# Patient Record
Sex: Male | Born: 1975
Health system: Southern US, Community
[De-identification: ages and names within clinical notes are randomized; demographics above are authoritative.]

## PROBLEM LIST (undated history)

## (undated) DIAGNOSIS — M549 Dorsalgia, unspecified: Secondary | ICD-10-CM

## (undated) DIAGNOSIS — F909 Attention-deficit hyperactivity disorder, unspecified type: Secondary | ICD-10-CM

## (undated) HISTORY — DX: Attention-deficit hyperactivity disorder, unspecified type: F90.9

## (undated) HISTORY — PX: NO PAST SURGERIES: SHX2092

---

## 2001-01-12 ENCOUNTER — Emergency Department (HOSPITAL_COMMUNITY): Admission: EM | Admit: 2001-01-12 | Discharge: 2001-01-12 | Payer: Self-pay | Admitting: Emergency Medicine

## 2001-03-07 ENCOUNTER — Emergency Department (HOSPITAL_COMMUNITY): Admission: EM | Admit: 2001-03-07 | Discharge: 2001-03-07 | Payer: Self-pay | Admitting: Emergency Medicine

## 2004-12-04 ENCOUNTER — Emergency Department (HOSPITAL_COMMUNITY): Admission: EM | Admit: 2004-12-04 | Discharge: 2004-12-04 | Payer: Self-pay | Admitting: *Deleted

## 2005-02-19 ENCOUNTER — Emergency Department (HOSPITAL_COMMUNITY): Admission: EM | Admit: 2005-02-19 | Discharge: 2005-02-19 | Payer: Self-pay | Admitting: Family Medicine

## 2009-06-03 ENCOUNTER — Emergency Department (HOSPITAL_COMMUNITY): Admission: EM | Admit: 2009-06-03 | Discharge: 2009-06-03 | Payer: Self-pay | Admitting: Emergency Medicine

## 2012-02-04 ENCOUNTER — Emergency Department (HOSPITAL_COMMUNITY)
Admission: EM | Admit: 2012-02-04 | Discharge: 2012-02-04 | Disposition: A | Discharge status: Home / Self Care | Payer: Medicaid Other | Attending: Emergency Medicine | Admitting: Emergency Medicine

## 2012-02-04 ENCOUNTER — Emergency Department (HOSPITAL_COMMUNITY): Payer: Medicaid Other

## 2012-02-04 ENCOUNTER — Encounter (HOSPITAL_COMMUNITY): Payer: Self-pay

## 2012-02-04 DIAGNOSIS — IMO0001 Reserved for inherently not codable concepts without codable children: Secondary | ICD-10-CM | POA: Insufficient documentation

## 2012-02-04 DIAGNOSIS — M533 Sacrococcygeal disorders, not elsewhere classified: Secondary | ICD-10-CM | POA: Insufficient documentation

## 2012-02-04 DIAGNOSIS — W1809XA Striking against other object with subsequent fall, initial encounter: Secondary | ICD-10-CM | POA: Insufficient documentation

## 2012-02-04 DIAGNOSIS — M791 Myalgia, unspecified site: Secondary | ICD-10-CM

## 2012-02-04 DIAGNOSIS — R079 Chest pain, unspecified: Secondary | ICD-10-CM | POA: Insufficient documentation

## 2012-02-04 DIAGNOSIS — M5380 Other specified dorsopathies, site unspecified: Secondary | ICD-10-CM | POA: Insufficient documentation

## 2012-02-04 DIAGNOSIS — M549 Dorsalgia, unspecified: Secondary | ICD-10-CM | POA: Insufficient documentation

## 2012-02-04 MED ORDER — NAPROXEN 500 MG PO TABS: 500.0000 mg | ORAL_TABLET | Freq: Two times a day (BID) | ORAL | Status: DC

## 2012-02-04 MED ORDER — METHOCARBAMOL 500 MG PO TABS: 500.0000 mg | ORAL_TABLET | Freq: Two times a day (BID) | ORAL | Status: AC

## 2012-02-04 MED ORDER — DIAZEPAM 5 MG PO TABS
5.0000 mg | ORAL_TABLET | Freq: Once | ORAL | Status: AC
  Administered 2012-02-04: 5 mg via ORAL
  Filled 2012-02-04: qty 1

## 2012-02-04 MED ORDER — DEXAMETHASONE SODIUM PHOSPHATE 10 MG/ML IJ SOLN
10.0000 mg | Freq: Once | INTRAMUSCULAR | Status: AC
  Administered 2012-02-04: 10 mg via INTRAMUSCULAR
  Filled 2012-02-04: qty 1

## 2012-02-04 MED ORDER — KETOROLAC TROMETHAMINE 60 MG/2ML IM SOLN
60.0000 mg | Freq: Once | INTRAMUSCULAR | Status: AC
  Administered 2012-02-04: 60 mg via INTRAMUSCULAR
  Filled 2012-02-04: qty 2

## 2012-02-04 MED ORDER — TRAMADOL HCL 50 MG PO TABS: 50.0000 mg | ORAL_TABLET | Freq: Four times a day (QID) | ORAL | Status: AC | PRN

## 2012-02-04 NOTE — Progress Notes (Signed)
Pt states he has been previously to [facility] But has not been seen lately.  CM noted pt listed as medicaid pt. CM provided pt with [facility] number to obtain a new pcp and have DSS to apply to his card.

## 2012-02-04 NOTE — ED Notes (Signed)
Pt states fell 4' off a machine on Saturday, c/o tailbone, lower back pain.

## 2012-02-04 NOTE — ED Provider Notes (Signed)
Medical screening examination/treatment/procedure(s) were performed by non-physician practitioner and as supervising physician I was immediately available for consultation/collaboration.   Dione Booze, MD 02/04/12 [phone]

## 2012-02-04 NOTE — Discharge Instructions (Signed)
Musculoskeletal Pain Musculoskeletal pain is muscle and boney aches and pains. These pains can occur in any part of the body. Your caregiver may treat you without knowing the cause of the pain. They may treat you if blood or urine tests, X-rays, and other tests were normal.  CAUSES There is often not a definite cause or reason for these pains. These pains may be caused by a type of germ (virus). The discomfort may also come from overuse. Overuse includes working out too hard when your body is not fit. Boney aches also come from weather changes. Bone is sensitive to atmospheric pressure changes. HOME CARE INSTRUCTIONS   Ask when your test results will be ready. Make sure you get your test results.   Only take over-the-counter or prescription medicines for pain, discomfort, or fever as directed by your caregiver. If you were given medications for your condition, do not drive, operate machinery or power tools, or sign legal documents for 24 hours. Do not drink alcohol. Do not take sleeping pills or other medications that may interfere with treatment.   Continue all activities unless the activities cause more pain. When the pain lessens, slowly resume normal activities. Gradually increase the intensity and duration of the activities or exercise.   During periods of severe pain, bed rest may be helpful. Lay or sit in any position that is comfortable.   Putting ice on the injured area.   Put ice in a bag.   Place a towel between your skin and the bag.   Leave the ice on for 15 to 20 minutes, 3 to 4 times a day.   Follow up with your caregiver for continued problems and no reason can be found for the pain. If the pain becomes worse or does not go away, it may be necessary to repeat tests or do additional testing. Your caregiver may need to look further for a possible cause.  SEEK IMMEDIATE MEDICAL CARE IF:  You have pain that is getting worse and is not relieved by medications.   You develop  chest pain that is associated with shortness or breath, sweating, feeling sick to your stomach (nauseous), or throw up (vomit).   Your pain becomes localized to the abdomen.   You develop any new symptoms that seem different or that concern you.  MAKE SURE YOU:   Understand these instructions.   Will watch your condition.   Will get help right away if you are not doing well or get worse.  Document Released: 11/26/2005 Document Revised: 08/08/2011 Document Reviewed: 07/16/2008 Exit[employer] Patient Information 2012 [employer]. 

## 2012-02-04 NOTE — ED Notes (Signed)
Pt is stable at d/c 

## 2012-02-04 NOTE — ED Provider Notes (Signed)
History     CSN: [id number]  Arrival date & time 02/04/12  1113   First MD Initiated Contact with Patient 02/04/12 1140      12:00 PM HPI Patient reports 2 days ago fell backwards off of a machine and onto his buttocks. Reports since then has had significant right buttock pain and right mid back. Denies blood in urine. Denies numbness, tingling, weakness. Denies radiation of pain. Denies saddle anesthesias, perineal numbness, incontinence, abdominal pain, hematuria. Patient is a 36 y.o. male presenting with fall. The history is provided by the patient.  Fall The accident occurred 2 days ago. He fell from a height of 3 to 5 ft. He landed on a hard floor. There was no blood loss. Point of impact: Right buttock and back. Pain location: Right buttock and back. The pain is severe. He was ambulatory at the scene. There was no entrapment after the fall. There was no drug use involved in the accident. There was no alcohol use involved in the accident. Pertinent negatives include no fever, no numbness, no abdominal pain, no bowel incontinence, no nausea, no vomiting, no hematuria, no headaches, no loss of consciousness and no tingling. The symptoms are aggravated by activity, standing, sitting and ambulation. He has tried NSAIDs for the symptoms. The treatment provided no relief.    History reviewed. No pertinent past medical history.  History reviewed. No pertinent past surgical history.  No family history on file.  History  Substance Use Topics  . Smoking status: Current Everyday Smoker  . Smokeless tobacco: Not on file  . Alcohol Use: Yes      Review of Systems  Constitutional: Negative for fever and chills.  HENT: Negative for neck pain.   Respiratory: Negative for cough and shortness of breath.   Cardiovascular: Negative for chest pain and palpitations.  Gastrointestinal: Negative for nausea, vomiting, abdominal pain and bowel incontinence.  Genitourinary: Negative for dysuria,  hematuria and flank pain.  Musculoskeletal: Positive for back pain. Negative for myalgias and gait problem.       Denies saddle anesthesias, perineal numbness, bowel incontinence, urinary incontinence  Neurological: Negative for dizziness, tingling, loss of consciousness, weakness, numbness and headaches.  All other systems reviewed and are negative.    Allergies  Review of patient's allergies indicates no known allergies.  Home Medications   Current Outpatient Rx  Name Route Sig Dispense Refill  . IBUPROFEN 200 MG PO TABS Oral Take 200 mg by mouth every 6 (six) hours as needed. pain    . LISDEXAMFETAMINE DIMESYLATE 50 MG PO CAPS Oral Take 50 mg by mouth every morning.    Marland Kitchen TRAMADOL HCL 50 MG PO TABS Oral Take 50-100 mg by mouth every 6 (six) hours as needed. pain      BP 136/79  Pulse 100  Temp(Src) 97.8 F (36.6 C) (Oral)  Resp 24  SpO2 100%  Physical Exam  Constitutional: He is oriented to person, place, and time. He appears well-developed and well-nourished.  HENT:  Head: Normocephalic and atraumatic.  Eyes: Conjunctivae are normal. Pupils are equal, round, and reactive to light.  Neck: Normal range of motion. Neck supple.  Cardiovascular: Normal rate, regular rhythm and normal heart sounds.   Pulmonary/Chest: Effort normal and breath sounds normal.  Abdominal: Soft. Bowel sounds are normal.  Musculoskeletal:       Right hip: He exhibits decreased range of motion and tenderness. He exhibits normal strength, no bony tenderness, no swelling and no laceration.  Thoracic back: He exhibits pain and spasm. He exhibits normal range of motion, no bony tenderness, no swelling, no laceration and normal pulse.       Lumbar back: He exhibits tenderness, pain and spasm. He exhibits normal range of motion, no swelling, no edema and normal pulse.       Back:       Legs: Neurological: He is alert and oriented to person, place, and time.  Skin: Skin is warm and dry. No rash noted.  No erythema. No pallor.  Psychiatric: He has a normal mood and affect. His behavior is normal.    ED Course  Procedures  No results found for this or any previous visit. Dg Ribs Unilateral W/chest Right  02/04/2012  *RADIOLOGY REPORT*  Clinical Data: Fall.  Right-sided rib pain.  RIGHT RIBS AND CHEST - 3+ VIEW  Comparison: None.  Findings: Heart size is normal.  Mediastinal shadows are normal. Lungs are clear.  No pneumothorax or hemothorax.  Rib details do not show any discernible fractures.  IMPRESSION: Negative radiographs  Original Report Authenticated By: Thomasenia Sales, M.D.   Dg Lumbar Spine Complete  02/04/2012  *RADIOLOGY REPORT*  Clinical Data: Larey Seat.  Back pain.  LUMBAR SPINE - COMPLETE 4+ VIEW  Comparison: None  Findings: The lateral film demonstrates normal alignment. Vertebral bodies and disc spaces are maintained.  No acute bony findings.  Normal alignment of the facet joints and no pars defects.  The visualized bony pelvis in intact.  IMPRESSION: Normal alignment and no acute bony findings.  Original Report Authenticated By: P. Loralie Champagne, M.D.   Dg Sacrum/coccyx  02/04/2012  *RADIOLOGY REPORT*  Clinical Data: Larey Seat.  Sacral pain.  SACRUM AND COCCYX - 2+ VIEW  Comparison: None  Findings: The pubic symphysis and SI joints are intact.  No definite sacral or coccyx fractures.  Degenerative disc disease noted at L5-S1.  IMPRESSION: No acute bony findings.  Original Report Authenticated By: P. Loralie Champagne, M.D.     MDM   Patient had significant pain and spasm of right buttock and right mid back. Has absolutely no bony tenderness. Likely has contusion versus muscle spasms. Will prescribe muscle relaxants and pain medication. Advised gentle range of motion exercises massage and warm compresses. Patient agrees to plan and is ready for discharge.      Thomasene Lot, PA-C 02/04/12 1253

## 2012-05-17 ENCOUNTER — Encounter (HOSPITAL_COMMUNITY): Payer: Self-pay

## 2012-05-17 ENCOUNTER — Emergency Department (HOSPITAL_COMMUNITY): Payer: Medicaid Other

## 2012-05-17 ENCOUNTER — Emergency Department (HOSPITAL_COMMUNITY)
Admission: EM | Admit: 2012-05-17 | Discharge: 2012-05-17 | Disposition: A | Discharge status: Home / Self Care | Payer: Medicaid Other | Attending: Emergency Medicine | Admitting: Emergency Medicine

## 2012-05-17 DIAGNOSIS — M545 Low back pain, unspecified: Secondary | ICD-10-CM | POA: Insufficient documentation

## 2012-05-17 DIAGNOSIS — S20229A Contusion of unspecified back wall of thorax, initial encounter: Secondary | ICD-10-CM

## 2012-05-17 DIAGNOSIS — F17200 Nicotine dependence, unspecified, uncomplicated: Secondary | ICD-10-CM | POA: Insufficient documentation

## 2012-05-17 MED ORDER — DIAZEPAM 5 MG PO TABS
5.0000 mg | ORAL_TABLET | Freq: Once | ORAL | Status: AC
  Administered 2012-05-17: 5 mg via ORAL
  Filled 2012-05-17: qty 1

## 2012-05-17 MED ORDER — METHOCARBAMOL 500 MG PO TABS: ORAL_TABLET | ORAL | Status: DC

## 2012-05-17 MED ORDER — TRAMADOL HCL 50 MG PO TABS: 50.0000 mg | ORAL_TABLET | Freq: Four times a day (QID) | ORAL | Status: AC | PRN

## 2012-05-17 MED ORDER — DEXAMETHASONE SODIUM PHOSPHATE 4 MG/ML IJ SOLN
10.0000 mg | Freq: Once | INTRAMUSCULAR | Status: AC
  Administered 2012-05-17: 10 mg via INTRAMUSCULAR
  Filled 2012-05-17: qty 3

## 2012-05-17 NOTE — ED Provider Notes (Signed)
History     CSN: [id number]  Arrival date & time 05/17/12  1525   First MD Initiated Contact with Patient 05/17/12 1548      Chief Complaint  Patient presents with  . Tailbone Pain    (Consider location/radiation/quality/duration/timing/severity/associated sxs/prior treatment) HPI Comments: Patient c/o lower back pain and pain to his tailbone after he fell on some steps two days prior to arrival.  He denies other injuries, incontinence, urinary sx's or weakness to his extrmeities  Patient is a 36 y.o. male presenting with back pain. The history is provided by the patient.  Back Pain  This is a new problem. The current episode started 2 days ago. The problem occurs constantly. The problem has not changed since onset.The pain is associated with falling. The pain is present in the lumbar spine (coccyx). The quality of the pain is described as aching. The pain does not radiate. The pain is moderate. The symptoms are aggravated by bending, certain positions and twisting. The pain is the same all the time. Pertinent negatives include no chest pain, no fever, no numbness, no abdominal pain, no abdominal swelling, no bowel incontinence, no perianal numbness, no bladder incontinence, no dysuria, no pelvic pain, no leg pain, no paresthesias, no paresis, no tingling and no weakness. He has tried NSAIDs for the symptoms. The treatment provided no relief.    History reviewed. No pertinent past medical history.  History reviewed. No pertinent past surgical history.  No family history on file.  History  Substance Use Topics  . Smoking status: Current Everyday Smoker  . Smokeless tobacco: Not on file  . Alcohol Use: Yes      Review of Systems  Constitutional: Negative for fever.  Respiratory: Negative for shortness of breath.   Cardiovascular: Negative for chest pain.  Gastrointestinal: Negative for vomiting, abdominal pain, constipation and bowel incontinence.  Genitourinary: Negative for  bladder incontinence, dysuria, frequency, hematuria, flank pain, decreased urine volume, difficulty urinating, testicular pain and pelvic pain.       Perineal numbness or incontinence of urine or feces  Musculoskeletal: Positive for back pain. Negative for joint swelling.  Skin: Negative for rash.  Neurological: Negative for tingling, weakness, numbness and paresthesias.  All other systems reviewed and are negative.    Allergies  Review of patient's allergies indicates no known allergies.  Home Medications   Current Outpatient Rx  Name Route Sig Dispense Refill  . IBUPROFEN 200 MG PO TABS Oral Take 200 mg by mouth every 6 (six) hours as needed. pain    . LISDEXAMFETAMINE DIMESYLATE 50 MG PO CAPS Oral Take 50 mg by mouth every morning.    Marland Kitchen NAPROXEN 500 MG PO TABS Oral Take 1 tablet (500 mg total) by mouth 2 (two) times daily. 30 tablet 0  . TRAMADOL HCL 50 MG PO TABS Oral Take 50-100 mg by mouth every 6 (six) hours as needed. pain      BP 120/78  Pulse 110  Temp(Src) 97.7 F (36.5 C) (Oral)  Resp 20  Ht 6\' 1"  (1.854 m)  Wt 210 lb (95.255 kg)  BMI 27.71 kg/m2  SpO2 97%  Physical Exam  Nursing note and vitals reviewed. Constitutional: He is oriented to person, place, and time. He appears well-developed and well-nourished. No distress.  HENT:  Head: Normocephalic and atraumatic.  Neck: Normal range of motion. Neck supple.  Cardiovascular: Normal rate, regular rhythm and intact distal pulses.   No murmur heard. Pulmonary/Chest: Effort normal and breath sounds normal.  Musculoskeletal: He exhibits tenderness. He exhibits no edema.       Lumbar back: He exhibits tenderness and pain. He exhibits normal range of motion, no swelling, no edema, no deformity, no laceration and normal pulse.       Back:       ttp of the lower lumbar spine,  No bruising or deformity on exam.  Neurological: He is alert and oriented to person, place, and time. No cranial nerve deficit or sensory  deficit. He exhibits normal muscle tone. Coordination and gait normal.  Reflex Scores:      Patellar reflexes are 2+ on the right side and 2+ on the left side.      Achilles reflexes are 2+ on the right side and 2+ on the left side. Skin: Skin is warm and dry.    ED Course  Procedures (including critical care time)  Labs Reviewed - No data to display Dg Lumbar Spine Complete  05/17/2012  *RADIOLOGY REPORT*  Clinical Data: Tail bone pain  LUMBAR SPINE - COMPLETE 4+ VIEW  Comparison: 02/04/2012  Findings: Five views of the lumbar spine submitted.  No acute fracture or subluxation.  Mild disc space flattening at L5 S1 level.  Alignment and vertebral height are preserved.  IMPRESSION: No acute fracture or subluxation.  Mild disc space flattening at L5 S1 level.  Original Report Authenticated By: Natasha Mead, M.D.       MDM    Previous medical charts, nursing notes and vitals signs from this visit were reviewed by me   All laboratory results and/or imaging results performed on this visit, if applicable, were reviewed by me and discussed with the patient and/or parent as well as recommendation for follow-up    MEDICATIONS GIVEN IN ED: decadron IM, valium po  Patient has ttp of the lumbar paraspinal muscles mainly to the left.  No focal neuro deficits on exam.  Ambulates with a steady gait.       PRESCRIPTIONS GIVEN AT DISCHARGE: tramadol, flexeril    Pt stable in ED with no significant deterioration in condition. Pt feels improved after observation and/or treatment in ED. Patient / Family / Caregiver understand and agree with initial ED impression and plan with expectations set for ED visit.  Patient agrees to return to ED for any worsening symptoms          Keyanah Kozicki L. [place], [place] 05/21/12 1641

## 2012-05-17 NOTE — Discharge Instructions (Signed)
Contusion  A contusion is a deep bruise. Contusions happen when an injury causes bleeding under the skin. Signs of bruising include pain, puffiness (swelling), and discolored skin. The contusion may turn blue, purple, or yellow.  HOME CARE    Put ice on the injured area.   Put ice in a plastic bag.   Place a towel between your skin and the bag.   Leave the ice on for 15 to 20 minutes, 3 to 4 times a day.   Only take medicine as told by your doctor.   Rest the injured area.   If possible, raise (elevate) the injured area to lessen puffiness.  GET HELP RIGHT AWAY IF:    You have more bruising or puffiness.   You have pain that is getting worse.   Your puffiness or pain is not helped by medicine.  MAKE SURE YOU:    Understand these instructions.   Will watch your condition.   Will get help right away if you are not doing well or get worse.  Document Released: 05/14/2008 Document Revised: 11/15/2011 Document Reviewed: 10/01/2011  [employer] Patient Information 2012 [employer].

## 2012-05-17 NOTE — ED Notes (Signed)
Complain of low back pain. States he fell on steps two days ago and now tailbone hurts

## 2012-05-21 NOTE — ED Provider Notes (Signed)
Medical screening examination/treatment/procedure(s) were performed by non-physician practitioner and as supervising physician I was immediately available for consultation/collaboration.  Ramello Cordial, MD 05/21/12 2207 

## 2012-07-12 ENCOUNTER — Emergency Department (HOSPITAL_COMMUNITY)
Admission: EM | Admit: 2012-07-12 | Discharge: 2012-07-12 | Disposition: A | Discharge status: Home / Self Care | Payer: Self-pay | Attending: Emergency Medicine | Admitting: Emergency Medicine

## 2012-07-12 ENCOUNTER — Encounter (HOSPITAL_COMMUNITY): Payer: Self-pay

## 2012-07-12 DIAGNOSIS — M5440 Lumbago with sciatica, unspecified side: Secondary | ICD-10-CM

## 2012-07-12 DIAGNOSIS — M5430 Sciatica, unspecified side: Secondary | ICD-10-CM | POA: Insufficient documentation

## 2012-07-12 DIAGNOSIS — F17200 Nicotine dependence, unspecified, uncomplicated: Secondary | ICD-10-CM | POA: Insufficient documentation

## 2012-07-12 HISTORY — DX: Dorsalgia, unspecified: M54.9

## 2012-07-12 MED ORDER — PENICILLIN V POTASSIUM 500 MG PO TABS: 500.0000 mg | ORAL_TABLET | Freq: Once | ORAL | Status: DC

## 2012-07-12 MED ORDER — TRAMADOL HCL 50 MG PO TABS: 50.0000 mg | ORAL_TABLET | Freq: Four times a day (QID) | ORAL | Status: AC | PRN

## 2012-07-12 MED ORDER — TRIAMCINOLONE ACETONIDE 40 MG/ML IJ SUSP
40.0000 mg | Freq: Once | INTRAMUSCULAR | Status: AC
  Administered 2012-07-12: 40 mg via INTRAMUSCULAR
  Filled 2012-07-12: qty 1
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
## 2012-07-12 NOTE — ED Notes (Addendum)
Pt presents with no acute distress- Pt seen at [facility] in 05/17/2012 for presenting complaint.  Pt here for pain medication and refill of other medication.  No new symptoms c/o of back pain radiates to rt knee-

## 2012-07-13 NOTE — ED Provider Notes (Signed)
Medical screening examination/treatment/procedure(s) were performed by non-physician practitioner and as supervising physician I was immediately available for consultation/collaboration.   Lyanne Co, MD 07/13/12 0010

## 2012-08-15 ENCOUNTER — Encounter (HOSPITAL_COMMUNITY): Payer: Self-pay | Admitting: *Deleted

## 2012-08-15 ENCOUNTER — Emergency Department (HOSPITAL_COMMUNITY)
Admission: EM | Admit: 2012-08-15 | Discharge: 2012-08-15 | Disposition: A | Discharge status: Home / Self Care | Payer: Self-pay | Attending: Emergency Medicine | Admitting: Emergency Medicine

## 2012-08-15 DIAGNOSIS — F17200 Nicotine dependence, unspecified, uncomplicated: Secondary | ICD-10-CM | POA: Insufficient documentation

## 2012-08-15 DIAGNOSIS — M549 Dorsalgia, unspecified: Secondary | ICD-10-CM | POA: Insufficient documentation

## 2012-08-15 MED ORDER — IBUPROFEN 800 MG PO TABS
800.0000 mg | ORAL_TABLET | Freq: Once | ORAL | Status: AC
  Administered 2012-08-15: 800 mg via ORAL
  Filled 2012-08-15: qty 1

## 2012-08-15 MED ORDER — IBUPROFEN 600 MG PO TABS: 600.0000 mg | ORAL_TABLET | Freq: Four times a day (QID) | ORAL | Status: AC | PRN

## 2012-08-15 MED ORDER — OXYCODONE-ACETAMINOPHEN 5-325 MG PO TABS
1.0000 | ORAL_TABLET | Freq: Once | ORAL | Status: AC
  Administered 2012-08-15: 1 via ORAL
  Filled 2012-08-15: qty 1
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
## 2012-08-15 NOTE — ED Notes (Signed)
Pt upset about the medication he received. Says he wants to see another doctor.  Want rx for tramadol.  Dr Bebe Shaggy aware. Told pt he would have to be d/c and check back in to see another doctor.

## 2012-08-15 NOTE — ED Notes (Signed)
C/o lower back pain with pain radiating to right buttock and right leg

## 2012-08-15 NOTE — ED Notes (Signed)
Back pain with radiation down rt leg, alert, talking

## 2012-11-01 ENCOUNTER — Encounter (HOSPITAL_COMMUNITY): Payer: Self-pay | Admitting: Emergency Medicine

## 2012-11-01 ENCOUNTER — Emergency Department (HOSPITAL_COMMUNITY)
Admission: EM | Admit: 2012-11-01 | Discharge: 2012-11-01 | Disposition: A | Discharge status: Home / Self Care | Payer: Self-pay | Attending: Emergency Medicine | Admitting: Emergency Medicine

## 2012-11-01 ENCOUNTER — Emergency Department (HOSPITAL_COMMUNITY): Payer: Self-pay

## 2012-11-01 DIAGNOSIS — IMO0002 Reserved for concepts with insufficient information to code with codable children: Secondary | ICD-10-CM | POA: Insufficient documentation

## 2012-11-01 DIAGNOSIS — F17200 Nicotine dependence, unspecified, uncomplicated: Secondary | ICD-10-CM | POA: Insufficient documentation

## 2012-11-01 DIAGNOSIS — S0990XA Unspecified injury of head, initial encounter: Secondary | ICD-10-CM | POA: Insufficient documentation

## 2012-11-01 DIAGNOSIS — H1130 Conjunctival hemorrhage, unspecified eye: Secondary | ICD-10-CM

## 2012-11-01 DIAGNOSIS — S20219A Contusion of unspecified front wall of thorax, initial encounter: Secondary | ICD-10-CM

## 2012-11-01 MED ORDER — PREDNISONE 50 MG PO TABS: 50.0000 mg | ORAL_TABLET | Freq: Every day | ORAL | Status: DC

## 2012-11-01 MED ORDER — DEXAMETHASONE SODIUM PHOSPHATE 10 MG/ML IJ SOLN
10.0000 mg | Freq: Once | INTRAMUSCULAR | Status: AC
  Administered 2012-11-01: 10 mg via INTRAMUSCULAR
  Filled 2012-11-01: qty 1

## 2012-11-01 MED ORDER — OXYCODONE-ACETAMINOPHEN 5-325 MG PO TABS
1.0000 | ORAL_TABLET | Freq: Once | ORAL | Status: AC
  Administered 2012-11-01: 1 via ORAL
  Filled 2012-11-01: qty 1

## 2012-11-01 MED ORDER — CYCLOBENZAPRINE HCL 10 MG PO TABS: 10.0000 mg | ORAL_TABLET | Freq: Three times a day (TID) | ORAL | Status: DC | PRN

## 2012-11-01 MED ORDER — HYDROCODONE-ACETAMINOPHEN 5-325 MG PO TABS: 1.0000 | ORAL_TABLET | Freq: Four times a day (QID) | ORAL | Status: DC | PRN

## 2012-11-01 MED ORDER — IBUPROFEN 800 MG PO TABS
800.0000 mg | ORAL_TABLET | Freq: Once | ORAL | Status: AC
  Administered 2012-11-01: 800 mg via ORAL
  Filled 2012-11-01: qty 1
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
## 2012-11-01 NOTE — ED Notes (Signed)
Patient not in room at this time.

## 2012-11-01 NOTE — ED Notes (Signed)
Patient given discharge instructions, information, prescriptions, and diet order. Patient states that they adequately understand discharge information given and to return to ED if symptoms return or worsen.     
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
## 2012-11-04 NOTE — ED Provider Notes (Signed)
Medical screening examination/treatment/procedure(s) were conducted as a shared visit with non-physician practitioner(s) and myself.  I personally evaluated the patient during the encounter.  No neuro deficits. CT head and CT maxillofacial negative for fracture  Donnetta Hutching, MD 11/04/12 [phone]

## 2014-02-16 ENCOUNTER — Emergency Department (HOSPITAL_COMMUNITY)
Admission: EM | Admit: 2014-02-16 | Discharge: 2014-02-16 | Discharge status: Against Medical Advice | Payer: Medicaid Other

## 2018-09-10 ENCOUNTER — Encounter (HOSPITAL_COMMUNITY): Payer: Self-pay

## 2018-09-10 ENCOUNTER — Ambulatory Visit (HOSPITAL_COMMUNITY)
Admission: EM | Admit: 2018-09-10 | Discharge: 2018-09-10 | Disposition: A | Discharge status: Home / Self Care | Payer: Self-pay | Attending: Family Medicine | Admitting: Family Medicine

## 2018-09-10 DIAGNOSIS — H6993 Unspecified Eustachian tube disorder, bilateral: Secondary | ICD-10-CM

## 2018-09-10 DIAGNOSIS — T7840XA Allergy, unspecified, initial encounter: Secondary | ICD-10-CM

## 2018-09-10 DIAGNOSIS — H6983 Other specified disorders of Eustachian tube, bilateral: Secondary | ICD-10-CM

## 2018-09-10 MED ORDER — CETIRIZINE HCL 10 MG PO TABS: 10.0000 mg | ORAL_TABLET | Freq: Every day | ORAL | 0 refills | Status: DC

## 2018-09-10 MED ORDER — HYDROXYZINE HCL 25 MG PO TABS: 25.0000 mg | ORAL_TABLET | ORAL | 0 refills | Status: DC | PRN

## 2018-09-10 MED ORDER — KETOTIFEN FUMARATE 0.025 % OP SOLN: 1.0000 [drp] | Freq: Two times a day (BID) | OPHTHALMIC | 0 refills | Status: DC

## 2018-09-10 MED ORDER — FLUTICASONE PROPIONATE 50 MCG/ACT NA SUSP: 1.0000 | Freq: Every day | NASAL | 2 refills | Status: DC

## 2018-09-10 NOTE — Discharge Instructions (Signed)
It was nice meeting you!! We will give you Zyrtec, Senatore and Flonase for your symptoms Make sure you are staying hydrated with plenty of water Symptoms could also be related to anxiety I will give you a few atarax to take as needed for panic attack.  Follow up as needed for continued or worsening symptoms

## 2018-09-10 NOTE — ED Triage Notes (Signed)
Pt presents with dizziness, shortness of breath, headache, ringing in ears, pale, clammy, and nausea

## 2018-09-10 NOTE — ED Provider Notes (Signed)
[facility]    CSN: [id number] Arrival date & time: 09/10/18  1249     History   Chief Complaint Chief Complaint  Patient presents with  . Dizziness  . Sweating  . Headache  . Ringing in Ears    HPI ZYERE JIMINEZ is a 42 y.o. male.   Pt is a 42 year old male that presents with intermittent dizziness, headache, sinus congestion and fullness in the ears. This has been coming and going for a while. What concerned him is that this am he had an episode at work today where he felt dizzy, lightheaded and had some chest tightness and diaphoresis. He was working outside in the heat. Reports he has been staying hydrated. He worked out this am and had a big breakfast. His coworkers were concerned for his BP. His BP is normal here. He does suffer from allergies and has been having a lot of fullness in the ears, facial pressure and burning and watering of the eyes. Sometimes he has ringing in the ears. He also reports a lot of stress recently. He has had anxiety attacks in the past. Denies anything being super stressed lately. He denies any current chest pain, SOB, dizziness, vision changes. He previously used allergy eye drops in the past.   ROS per HPI      Past Medical History:  Diagnosis Date  . Back pain     There are no active problems to display for this patient.   History reviewed. No pertinent surgical history.     Home Medications    Prior to Admission medications   Medication Sig Start Date End Date Taking? Authorizing Provider  cetirizine (ZYRTEC) 10 MG tablet Take 1 tablet (10 mg total) by mouth daily. 09/10/18   Dahlia Byes A, NP  cyclobenzaprine (FLEXERIL) 10 MG tablet Take 1 tablet (10 mg total) by mouth 3 (three) times daily as needed for muscle spasms. 11/01/12   Lawyer, Cristal Deer, PA-C  fluticasone (FLONASE) 50 MCG/ACT nasal spray Place 1 spray into both nostrils daily. 09/10/18   Dahlia Byes A, NP  HYDROcodone-acetaminophen (NORCO/VICODIN) 5-325  MG per tablet Take 1 tablet by mouth every 6 (six) hours as needed for pain. 11/01/12   Lawyer, Cristal Deer, PA-C  hydrOXYzine (ATARAX/VISTARIL) 25 MG tablet Take 1 tablet (25 mg total) by mouth as needed (for anxiety attacks.). 09/10/18   Dahlia Byes A, NP  ibuprofen (ADVIL,MOTRIN) 200 MG tablet Take 200 mg by mouth every 6 (six) hours as needed. pain    [provider]  ketotifen (ZADITOR) 0.025 % ophthalmic solution Place 1 drop into both eyes 2 (two) times daily. 09/10/18   Dahlia Byes A, NP  predniSONE (DELTASONE) 50 MG tablet Take 1 tablet (50 mg total) by mouth daily. 11/01/12   Charlestine Night, PA-C    Family History History reviewed. No pertinent family history.  Social History Social History   Tobacco Use  . Smoking status: Current Every Day Smoker  Substance Use Topics  . Alcohol use: Yes  . Drug use: Not on file     Allergies   Patient has no known allergies.   Review of Systems Review of Systems   Physical Exam Triage Vital Signs ED Triage Vitals [09/10/18 1305]  Enc Vitals Group     BP 127/61     Pulse Rate 84     Resp 20     Temp 98.1 F (36.7 C)     Temp src  SpO2 99 %     Weight      Height      Head Circumference      Peak Flow      Pain Score      Pain Loc      Pain Edu?      Excl. in GC?    No data found.  Updated Vital Signs BP 127/61 (BP Location: Left Arm)   Pulse 84   Temp 98.1 F (36.7 C)   Resp 20   SpO2 99%   Visual Acuity Right Eye Distance:   Left Eye Distance:   Bilateral Distance:    Right Eye Near:   Left Eye Near:    Bilateral Near:     Physical Exam  Constitutional: He is oriented to person, place, and time. He appears well-developed and well-nourished.  Non-toxic appearance. He does not appear ill.  Very pleasant. Non toxic or ill appearing.     HENT:  Head: Normocephalic and atraumatic.  Bilateral TMs normal.  External ears normal.  Without posterior oropharyngeal erythema, tonsillar  swelling or exudates. No lesions.  No lymphadenopathy.     Eyes: Pupils are equal, round, and reactive to light. EOM are normal.  No nystagmus  Neck: Normal range of motion.  Cardiovascular: Normal rate, regular rhythm and normal heart sounds.  Pulmonary/Chest: Effort normal and breath sounds normal.  Lungs clear in all fields. No dyspnea or distress. No retractions or nasal flaring.     Abdominal: Soft. Bowel sounds are normal.  Musculoskeletal: Normal range of motion.  Lymphadenopathy:    He has no cervical adenopathy.  Neurological: He is alert and oriented to person, place, and time. He has normal strength. He is not disoriented. No cranial nerve deficit or sensory deficit.  No focal neuro deficits Cranial nerves intact Reflexes intact Gait normal Negative romberg  Skin: Skin is warm and dry.  Psychiatric: His mood appears anxious.  Nursing note and vitals reviewed.    UC Treatments / Results  Labs (all labs ordered are listed, but only abnormal results are displayed) Labs Reviewed - No data to display  EKG None  Radiology No results found.  Procedures Procedures (including critical care time)  Medications Ordered in UC Medications - No data to display  Initial Impression / Assessment and Plan / UC Course  I have reviewed the triage vital signs and the nursing notes.  Pertinent labs & imaging results that were available during my care of the patient were reviewed by me and considered in my medical decision making (see chart for details).     No focal neuro deficits on exam Vital signs stable, patient nontoxic or ill-appearing EKG showed normal sinus rhythm with normal rate Most likely eustachian tube dysfunction versus sinusitis. Will treat with Flonase, Zyrtec and Zaditor eyedrops Prescribed Atarax as needed for anxiety attacks Follow up as needed for continued or worsening symptoms    Final Clinical Impressions(s) / UC Diagnoses   Final diagnoses:   Dysfunction of both eustachian tubes  Allergic state, initial encounter     Discharge Instructions     It was nice meeting you!! We will give you Zyrtec, Senatore and Flonase for your symptoms Make sure you are staying hydrated with plenty of water Symptoms could also be related to anxiety I will give you a few atarax to take as needed for panic attack.  Follow up as needed for continued or worsening symptoms      ED Prescriptions  Medication Sig Dispense Auth. Provider   fluticasone (FLONASE) 50 MCG/ACT nasal spray Place 1 spray into both nostrils daily. 16 g Stewart Sasaki A, NP   ketotifen (ZADITOR) 0.025 % ophthalmic solution Place 1 drop into both eyes 2 (two) times daily. 5 mL Lafawn Lenoir A, NP   cetirizine (ZYRTEC) 10 MG tablet Take 1 tablet (10 mg total) by mouth daily. 30 tablet Orva Riles A, NP   hydrOXYzine (ATARAX/VISTARIL) 25 MG tablet Take 1 tablet (25 mg total) by mouth as needed (for anxiety attacks.). 6 tablet Dahlia Byes A, NP     Controlled Substance Prescriptions [place] Controlled Substance Registry consulted? Not Applicable   Janace Aris, NP 09/10/18 2142

## 2019-01-22 ENCOUNTER — Encounter: Payer: Self-pay | Admitting: Family Medicine

## 2019-01-22 ENCOUNTER — Ambulatory Visit (INDEPENDENT_AMBULATORY_CARE_PROVIDER_SITE_OTHER): Payer: Self-pay | Admitting: Family Medicine

## 2019-01-22 VITALS — BP 114/77 | HR 73 | Resp 17 | Ht 72.0 in | Wt 223.4 lb

## 2019-01-22 DIAGNOSIS — H5789 Other specified disorders of eye and adnexa: Secondary | ICD-10-CM

## 2019-01-22 DIAGNOSIS — R0981 Nasal congestion: Secondary | ICD-10-CM

## 2019-01-22 DIAGNOSIS — R05 Cough: Secondary | ICD-10-CM

## 2019-01-22 DIAGNOSIS — Z9109 Other allergy status, other than to drugs and biological substances: Secondary | ICD-10-CM

## 2019-01-22 DIAGNOSIS — F17210 Nicotine dependence, cigarettes, uncomplicated: Secondary | ICD-10-CM

## 2019-01-22 DIAGNOSIS — R6889 Other general symptoms and signs: Secondary | ICD-10-CM

## 2019-01-22 DIAGNOSIS — Z7689 Persons encountering health services in other specified circumstances: Secondary | ICD-10-CM

## 2019-01-22 MED ORDER — FLUTICASONE PROPIONATE 50 MCG/ACT NA SUSP: 2.0000 | Freq: Every day | NASAL | 12 refills | Status: DC

## 2019-01-22 MED ORDER — MONTELUKAST SODIUM 10 MG PO TABS: 10.0000 mg | ORAL_TABLET | Freq: Every day | ORAL | 3 refills | Status: DC

## 2019-01-22 MED ORDER — CETIRIZINE HCL 10 MG PO TABS: 10.0000 mg | ORAL_TABLET | Freq: Every day | ORAL | 11 refills | Status: DC

## 2019-01-22 NOTE — Patient Instructions (Addendum)
Thank you for choosing Primary Care at [facility] to be your medical home!    Matthew Gibson was seen by Joaquin Courts, FNP today.   Patrick Jupiter Fedewa's primary care provider is Bing Neighbors, FNP.   For the best care possible, you should try to see Joaquin Courts, FNP-C whenever you come to the clinic.   We look forward to seeing you again soon!  If you have any questions about your visit today, please call us at [phone] or feel free to reach your primary care provider via MyChart.      Allergies, Adult An allergy means that your body reacts to something that bothers it (allergen). It is not a normal reaction. This can happen from something that you:  Eat.  Breathe in.  Touch. You can have an allergy (be allergic) to:  Outdoor things, like: ? Pollen. ? Grass. ? Weeds.  Indoor things, like: ? Dust. ? Smoke. ? Pet dander.  Foods.  Medicines.  Things that bother your skin, like: ? Detergents. ? Chemicals. ? Latex.  Perfume.  Bugs. An allergy cannot spread from person to person (is not contagious). Follow these instructions at home:         Stay away from things that you know you are allergic to.  If you have allergies to things in the air, wash out your nose each day. Do it with one of these: ? A salt-water (saline) spray. ? A container (neti pot).  Take over-the-counter and prescription medicines only as told by your doctor.  Keep all follow-up visits as told by your doctor. This is important.  If you are at risk for a very bad allergy reaction (anaphylaxis), keep an auto-injector with you all the time. This is called an epinephrine injection. ? This is pre-measured medicine with a needle. You can put it into your skin by yourself. ? Right after you have a very bad allergy reaction, you or a person with you must give the medicine in less than a few minutes. This is an emergency.  If you have ever had a very bad allergy reaction, wear a  medical alert bracelet or necklace. Your very bad allergy should be written on it. Contact a health care provider if:  Your symptoms do not get better with treatment. Get help right away if:  You have symptoms of a very bad allergy reaction. These include: ? A swollen mouth, tongue, or throat. ? Pain or tightness in your chest. ? Trouble breathing. ? Being short of breath. ? Dizziness. ? Fainting. ? Very bad pain in your belly (abdomen). ? Throwing up (vomiting). ? Watery poop (diarrhea). Summary  An allergy means that your body reacts to something that bothers it (allergen). It is not a normal reaction.  Stay away from things that make your body react.  Take over-the-counter and prescription medicines only as told by your doctor.  If you are at risk for a very bad allergy reaction, carry an auto-injector (epinephrine injection) all the time. Also, wear a medical alert bracelet or necklace so people know about your allergy. This information is not intended to replace advice given to you by your health care provider. Make sure you discuss any questions you have with your health care provider. Document Released: 03/23/2013 Document Revised: 03/11/2017 Document Reviewed: 03/11/2017 Elsevier Interactive Patient Education  2019 [employer].

## 2019-01-22 NOTE — Progress Notes (Signed)
Matthew Gibson, is a 43 y.o. male  MVH:[id number]:[id number]  WUX:[id number]  DOB - 1976/01/31  CC:  Chief Complaint  Patient presents with  . Establish Care    [facility]ays in [facility]  . Allergic Rhinitis     c/o itchy watery eyes, nasal congestion, sneezing. was given Flonase by ED & that helped a lot       HPI: Matthew CumminsJesse is a 43 y.o. male is here today to establish care.   Matthew Gibson does not have a problem list on file.   Today's visit:  Complains of chronic allergic rhinitis with the following symptoms: Itchy eyes and blurring of vision. No matting or crusting of eyes. He has seen an opthalmology in the past that recommended antihistamine eye drops. He currently uses otc Alway antihistamines eye drops which improves eye itching. Other symptoms include chronic nasal congestion, cough with phlegm. He is long-term current smoker and cut back to 3 cigarettes per day. Works in a [employer] which he feels has recently worsened symptoms. [facility] and is enrolled in their substance recovery program.  No formal allergy testing or evaluation. Prescribed Flonase in the past which was helpful in reducing nasal symptoms. Patient denies new headaches, chest pain or tightness, or epistaxis.  Current medications:No current outpatient medications on file.   Pertinent family medical history: family history includes Cancer in his father; Healthy in his mother; Hypertension in his father.   No Known Allergies  Social History   Socioeconomic History  . Marital status: Single    Spouse name: Not on file  . Number of children: Not on file  . Years of education: Not on file  . Highest education level: Not on file  Occupational History  . Not on file  Social Needs  . Financial resource strain: Not on file  . Food insecurity:    Worry: Not on file    Inability: Not on file  . Transportation needs:    Medical: Not on file    Non-medical: Not on file  Tobacco Use  . Smoking  status: Current Every Day Smoker  . Smokeless tobacco: Never Used  Substance and Sexual Activity  . Alcohol use: Yes  . Drug use: Not on file  . Sexual activity: Not on file  Lifestyle  . Physical activity:    Days per week: Not on file    Minutes per session: Not on file  . Stress: Not on file  Relationships  . Social connections:    Talks on phone: Not on file    Gets together: Not on file    Attends religious service: Not on file    Active member of club or organization: Not on file    Attends meetings of clubs or organizations: Not on file    Relationship status: Not on file  . Intimate partner violence:    Fear of current or ex partner: Not on file    Emotionally abused: Not on file    Physically abused: Not on file    Forced sexual activity: Not on file  Other Topics Concern  . Not on file  Social History Narrative  . Not on file    Review of Systems: Pertinent negatives listed in HPI Objective:   Vitals:   01/22/19 1024  BP: 114/77  Pulse: 73  Resp: 17  SpO2: 96%    BP Readings from Last 3 Encounters:  01/22/19 114/77  09/10/18 127/61  11/01/12 113/99  Filed Weights   01/22/19 1024  Weight: 223 lb 6.4 oz (101.3 kg)      Physical Exam: Constitutional: Patient appears well-developed and well-nourished. No distress. HENT: Normocephalic, atraumatic, External right and left ear normal. Nasal congestion present with dry and erythematous nares. Oropharynx is clear and moist.  Eyes: Conjunctivae and EOM are normal. PERRLA, no scleral icterus. Neck: Normal ROM. Neck supple. No JVD. No tracheal deviation. No thyromegaly. CVS: RRR, S1/S2 +, no murmurs, no gallops, no carotid bruit.  Pulmonary: Effort and breath sounds normal, no stridor, rhonchi, wheezes, rales.  Abdominal: Soft. BS +, no distension, tenderness, rebound or guarding.  Musculoskeletal: Normal range of motion. No edema and no tenderness.  Neuro: Alert. Normal muscle tone coordination. Normal  gait.  Skin: Skin is warm and dry. No rash noted. Not diaphoretic. No erythema. No pallor. Psychiatric: Normal mood and affect. Behavior, judgment, thought content normal.  Lab Results (prior encounters)       Assessment and plan:  1. Encounter to establish care 2. Itchy eyes -Continue Almay OTC antihistamine drops as directed.  3. Environmental allergies -Resuming Flonase -Start Singulair once daily  -Start Cetirizine once daily at bedtime    A total of 25  minutes spent, greater than 50 % of this time was spent counseling and coordination of care.  Meds ordered this encounter  Medications  . montelukast (SINGULAIR) 10 MG tablet    Sig: Take 1 tablet (10 mg total) by mouth at bedtime.    Dispense:  30 tablet    Refill:  3  . cetirizine (ZYRTEC) 10 MG tablet    Sig: Take 1 tablet (10 mg total) by mouth daily.    Dispense:  30 tablet    Refill:  11  . fluticasone (FLONASE) 50 MCG/ACT nasal spray    Sig: Place 2 sprays into both nostrils daily.    Dispense:  16 g    Refill:  12     No follow-ups on file.    The patient was given clear instructions to go to ER or return to medical center if symptoms don't improve, worsen or new problems develop. The patient verbalized understanding. The patient was advised  to call and obtain lab results if they haven't heard anything from out office within 7-10 business days.  Joaquin CourtsKimberly Griffon Herberg, FNP [facility] [address], [place] [id number] [phone]: [phone]    This note has been created with Dragon speech recognition software and Paediatric nursesmart phrase technology. Any transcriptional errors are unintentional.

## 2019-05-01 ENCOUNTER — Other Ambulatory Visit: Payer: Self-pay | Admitting: Family Medicine

## 2020-04-30 ENCOUNTER — Encounter (HOSPITAL_COMMUNITY): Payer: Self-pay | Admitting: Emergency Medicine

## 2020-04-30 ENCOUNTER — Emergency Department (HOSPITAL_COMMUNITY): Payer: Self-pay

## 2020-04-30 ENCOUNTER — Other Ambulatory Visit: Payer: Self-pay

## 2020-04-30 ENCOUNTER — Inpatient Hospital Stay (HOSPITAL_COMMUNITY): Payer: Self-pay

## 2020-04-30 ENCOUNTER — Inpatient Hospital Stay (HOSPITAL_COMMUNITY)
Admission: EM | Admit: 2020-04-30 | Discharge: 2020-05-03 | DRG: 603 | Disposition: A | Discharge status: Home / Self Care | Payer: Self-pay | Attending: Internal Medicine | Admitting: Internal Medicine

## 2020-04-30 DIAGNOSIS — F909 Attention-deficit hyperactivity disorder, unspecified type: Secondary | ICD-10-CM | POA: Diagnosis present

## 2020-04-30 DIAGNOSIS — L03113 Cellulitis of right upper limb: Secondary | ICD-10-CM | POA: Diagnosis present

## 2020-04-30 DIAGNOSIS — L02419 Cutaneous abscess of limb, unspecified: Secondary | ICD-10-CM | POA: Diagnosis present

## 2020-04-30 DIAGNOSIS — R519 Headache, unspecified: Secondary | ICD-10-CM | POA: Diagnosis present

## 2020-04-30 DIAGNOSIS — F1910 Other psychoactive substance abuse, uncomplicated: Secondary | ICD-10-CM | POA: Diagnosis present

## 2020-04-30 DIAGNOSIS — F419 Anxiety disorder, unspecified: Secondary | ICD-10-CM | POA: Diagnosis present

## 2020-04-30 DIAGNOSIS — L03119 Cellulitis of unspecified part of limb: Secondary | ICD-10-CM

## 2020-04-30 DIAGNOSIS — E871 Hypo-osmolality and hyponatremia: Secondary | ICD-10-CM

## 2020-04-30 DIAGNOSIS — L02413 Cutaneous abscess of right upper limb: Secondary | ICD-10-CM | POA: Diagnosis present

## 2020-04-30 DIAGNOSIS — Z20822 Contact with and (suspected) exposure to covid-19: Secondary | ICD-10-CM | POA: Diagnosis present

## 2020-04-30 DIAGNOSIS — Z8249 Family history of ischemic heart disease and other diseases of the circulatory system: Secondary | ICD-10-CM

## 2020-04-30 DIAGNOSIS — R21 Rash and other nonspecific skin eruption: Secondary | ICD-10-CM | POA: Diagnosis present

## 2020-04-30 DIAGNOSIS — L03114 Cellulitis of left upper limb: Principal | ICD-10-CM | POA: Diagnosis present

## 2020-04-30 DIAGNOSIS — E860 Dehydration: Secondary | ICD-10-CM | POA: Diagnosis present

## 2020-04-30 DIAGNOSIS — F17200 Nicotine dependence, unspecified, uncomplicated: Secondary | ICD-10-CM | POA: Diagnosis present

## 2020-04-30 LAB — CBC WITH DIFFERENTIAL/PLATELET
Abs Immature Granulocytes: 0.02 10*3/uL (ref 0.00–0.07)
Basophils Absolute: 0.1 10*3/uL (ref 0.0–0.1)
Basophils Relative: 0 %
Eosinophils Absolute: 0.1 10*3/uL (ref 0.0–0.5)
Eosinophils Relative: 1 %
HCT: 38.8 % — ABNORMAL LOW (ref 39.0–52.0)
Hemoglobin: 12.9 g/dL — ABNORMAL LOW (ref 13.0–17.0)
Immature Granulocytes: 0 %
Lymphocytes Relative: 9 %
Lymphs Abs: 1.1 10*3/uL (ref 0.7–4.0)
MCH: 29.9 pg (ref 26.0–34.0)
MCHC: 33.2 g/dL (ref 30.0–36.0)
MCV: 89.8 fL (ref 80.0–100.0)
Monocytes Absolute: 0.8 10*3/uL (ref 0.1–1.0)
Monocytes Relative: 7 %
Neutro Abs: 9.9 10*3/uL — ABNORMAL HIGH (ref 1.7–7.7)
Neutrophils Relative %: 83 %
Platelets: 300 10*3/uL (ref 150–400)
RBC: 4.32 MIL/uL (ref 4.22–5.81)
RDW: 13 % (ref 11.5–15.5)
WBC: 12 10*3/uL — ABNORMAL HIGH (ref 4.0–10.5)
nRBC: 0 % (ref 0.0–0.2)

## 2020-04-30 LAB — BASIC METABOLIC PANEL
Anion gap: 8 (ref 5–15)
BUN: 12 mg/dL (ref 6–20)
CO2: 26 mmol/L (ref 22–32)
Calcium: 8.4 mg/dL — ABNORMAL LOW (ref 8.9–10.3)
Chloride: 99 mmol/L (ref 98–111)
Creatinine, Ser: 1.01 mg/dL (ref 0.61–1.24)
GFR calc Af Amer: >60 mL/min (ref >60)
GFR calc non Af Amer: >60 mL/min (ref >60)
Glucose, Bld: 135 mg/dL — ABNORMAL HIGH (ref 70–99)
Potassium: 4.2 mmol/L (ref 3.5–5.1)
Sodium: 133 mmol/L — ABNORMAL LOW (ref 135–145)

## 2020-04-30 LAB — URINALYSIS, ROUTINE W REFLEX MICROSCOPIC
Bilirubin Urine: NEGATIVE
Glucose, UA: NEGATIVE mg/dL
Hgb urine dipstick: NEGATIVE
Ketones, ur: NEGATIVE mg/dL
Leukocytes,Ua: NEGATIVE
Nitrite: NEGATIVE
Protein, ur: NEGATIVE mg/dL
Specific Gravity, Urine: 1.012 (ref 1.005–1.030)
pH: 8 (ref 5.0–8.0)

## 2020-04-30 LAB — COMPREHENSIVE METABOLIC PANEL
ALT: 46 U/L — ABNORMAL HIGH (ref 0–44)
AST: 36 U/L (ref 15–41)
Albumin: 3.8 g/dL (ref 3.5–5.0)
Alkaline Phosphatase: 90 U/L (ref 38–126)
Anion gap: 10 (ref 5–15)
BUN: 15 mg/dL (ref 6–20)
CO2: 24 mmol/L (ref 22–32)
Calcium: 9.6 mg/dL (ref 8.9–10.3)
Chloride: 95 mmol/L — ABNORMAL LOW (ref 98–111)
Creatinine, Ser: 0.91 mg/dL (ref 0.61–1.24)
GFR calc Af Amer: >60 mL/min (ref >60)
GFR calc non Af Amer: >60 mL/min (ref >60)
Glucose, Bld: 174 mg/dL — ABNORMAL HIGH (ref 70–99)
Potassium: 4.1 mmol/L (ref 3.5–5.1)
Sodium: 129 mmol/L — ABNORMAL LOW (ref 135–145)
Total Bilirubin: 0.4 mg/dL (ref 0.3–1.2)
Total Protein: 6.9 g/dL (ref 6.5–8.1)

## 2020-04-30 LAB — HIV ANTIBODY (ROUTINE TESTING W REFLEX): HIV Screen 4th Generation wRfx: NONREACTIVE

## 2020-04-30 LAB — PROTIME-INR
INR: 1.1 (ref 0.8–1.2)
Prothrombin Time: 13.7 seconds (ref 11.4–15.2)

## 2020-04-30 LAB — LACTIC ACID, PLASMA: Lactic Acid, Venous: 1.9 mmol/L (ref 0.5–1.9)

## 2020-04-30 LAB — SARS CORONAVIRUS 2 BY RT PCR (HOSPITAL ORDER, PERFORMED IN ~~LOC~~ HOSPITAL LAB): SARS Coronavirus 2: NEGATIVE

## 2020-04-30 MED ORDER — VANCOMYCIN HCL IN DEXTROSE 1-5 GM/200ML-% IV SOLN
1000.0000 mg | Freq: Once | INTRAVENOUS | Status: AC
  Administered 2020-04-30: 1000 mg via INTRAVENOUS
  Filled 2020-04-30: qty 200

## 2020-04-30 MED ORDER — SODIUM CHLORIDE 0.9 % IV BOLUS
1000.0000 mL | Freq: Once | INTRAVENOUS | Status: AC
  Administered 2020-04-30: 1000 mL via INTRAVENOUS

## 2020-04-30 MED ORDER — OXYCODONE HCL 5 MG PO TABS
5.0000 mg | ORAL_TABLET | ORAL | Status: DC | PRN
  Administered 2020-04-30 – 2020-05-03 (×9): 5 mg via ORAL
  Filled 2020-04-30 (×9): qty 1

## 2020-04-30 MED ORDER — DIPHENHYDRAMINE HCL 25 MG PO CAPS
25.0000 mg | ORAL_CAPSULE | Freq: Once | ORAL | Status: AC
  Administered 2020-04-30: 25 mg via ORAL
  Filled 2020-04-30: qty 1

## 2020-04-30 MED ORDER — VANCOMYCIN HCL 1500 MG/300ML IV SOLN
1500.0000 mg | INTRAVENOUS | Status: AC
  Administered 2020-04-30: 1500 mg via INTRAVENOUS
  Filled 2020-04-30: qty 300

## 2020-04-30 MED ORDER — ACETAMINOPHEN 500 MG PO TABS
1000.0000 mg | ORAL_TABLET | Freq: Once | ORAL | Status: AC
  Administered 2020-04-30: 1000 mg via ORAL
  Filled 2020-04-30: qty 2

## 2020-04-30 MED ORDER — HYDROCORTISONE 1 % EX CREA
TOPICAL_CREAM | Freq: Three times a day (TID) | CUTANEOUS | Status: DC | PRN
  Filled 2020-04-30 (×3): qty 28

## 2020-04-30 MED ORDER — PIPERACILLIN-TAZOBACTAM 3.375 G IVPB 30 MIN
3.3750 g | Freq: Once | INTRAVENOUS | Status: AC
  Administered 2020-04-30: 3.375 g via INTRAVENOUS
  Filled 2020-04-30: qty 50

## 2020-04-30 MED ORDER — VANCOMYCIN HCL 1500 MG/300ML IV SOLN
1500.0000 mg | Freq: Two times a day (BID) | INTRAVENOUS | Status: DC
  Administered 2020-04-30 – 2020-05-01 (×3): 1500 mg via INTRAVENOUS
  Filled 2020-04-30 (×5): qty 300

## 2020-04-30 MED ORDER — LACTATED RINGERS IV SOLN: INTRAVENOUS | Status: DC

## 2020-04-30 MED ORDER — ALUM & MAG HYDROXIDE-SIMETH 200-200-20 MG/5ML PO SUSP
15.0000 mL | ORAL | Status: DC | PRN
  Administered 2020-04-30: 15 mL via ORAL
  Filled 2020-04-30: qty 30

## 2020-04-30 MED ORDER — SODIUM CHLORIDE (PF) 0.9 % IJ SOLN
INTRAMUSCULAR | Status: AC
  Filled 2020-04-30: qty 50

## 2020-04-30 MED ORDER — ACETAMINOPHEN 650 MG RE SUPP: 650.0000 mg | Freq: Four times a day (QID) | RECTAL | Status: DC | PRN

## 2020-04-30 MED ORDER — NICOTINE 21 MG/24HR TD PT24
21.0000 mg | MEDICATED_PATCH | Freq: Every day | TRANSDERMAL | Status: DC
  Administered 2020-05-02: 21 mg via TRANSDERMAL
  Filled 2020-04-30 (×2): qty 1

## 2020-04-30 MED ORDER — IOHEXOL 300 MG/ML  SOLN
100.0000 mL | Freq: Once | INTRAMUSCULAR | Status: AC | PRN
  Administered 2020-04-30: 100 mL via INTRAVENOUS

## 2020-04-30 MED ORDER — PANTOPRAZOLE SODIUM 40 MG PO TBEC
40.0000 mg | DELAYED_RELEASE_TABLET | Freq: Every day | ORAL | Status: DC
  Administered 2020-04-30 – 2020-05-03 (×4): 40 mg via ORAL
  Filled 2020-04-30 (×4): qty 1

## 2020-04-30 MED ORDER — ACETAMINOPHEN 325 MG PO TABS
650.0000 mg | ORAL_TABLET | Freq: Four times a day (QID) | ORAL | Status: DC | PRN
  Administered 2020-05-01: 650 mg via ORAL
  Filled 2020-04-30: qty 2

## 2020-04-30 NOTE — ED Notes (Signed)
ED TO INPATIENT HANDOFF REPORT  ED Nurse Name and Phone #: [phone]  S Name/Age/Gender Matthew Gibson 44 y.o. male Room/Bed: WA13/WA13  Code Status   Code Status: Not on file  Home/SNF/Other Home Patient oriented to: self, place, time and situation Is this baseline? Yes   Triage Complete: Triage complete  Chief Complaint Cellulitis and abscess of upper extremity [L03.119, L02.419]  Triage Note Patient presents with multiple abscesses on bialteral arms. Patient recently got out of prison, last IVDU 4-6 months ago. Patient states it started as one abscess that he attempted to drain himself multiple times and now he has spots on both arms.     Allergies No Known Allergies  Level of Care/Admitting Diagnosis ED Disposition    ED Disposition Condition Comment   Admit  Hospital Area: [facility] [[id number]]  Level of Care: Telemetry [5]  Admit to tele based on following criteria: Monitor for Ischemic changes  May admit patient to [facility] or [facility] if equivalent level of care is available:: Yes  Covid Evaluation: Asymptomatic Screening Protocol (No Symptoms)  Diagnosis: Cellulitis and abscess of upper extremity [4008676]  Admitting Physician: Briscoe Deutscher [[id number]]  Attending Physician: Briscoe Deutscher [[id number]]  Estimated length of stay: past midnight tomorrow  Certification:: I certify this patient will need inpatient services for at least 2 midnights       B Medical/Surgery History Past Medical History:  Diagnosis Date  . ADHD   . Back pain    Past Surgical History:  Procedure Laterality Date  . NO PAST SURGERIES       A IV Location/Drains/Wounds Patient Lines/Drains/Airways Status   Active Line/Drains/Airways    Name:   Placement date:   Placement time:   Site:   Days:   Peripheral IV 04/30/20 Left Forearm   04/30/20    0220    Forearm   less than 1          Intake/Output Last 24 hours  Intake/Output Summary (Last 24  hours) at 04/30/2020 1136 Last data filed at 04/30/2020 0418 Gross per 24 hour  Intake 1252.02 ml  Output --  Net 1252.02 ml    Labs/Imaging Results for orders placed or performed during the hospital encounter of 04/30/20 (from the past 48 hour(s))  Comprehensive metabolic panel     Status: Abnormal   Collection Time: 04/30/20  2:13 AM  Result Value Ref Range   Sodium 129 (L) 135 - 145 mmol/L   Potassium 4.1 3.5 - 5.1 mmol/L   Chloride 95 (L) 98 - 111 mmol/L   CO2 24 22 - 32 mmol/L   Glucose, Bld 174 (H) 70 - 99 mg/dL    Comment: Glucose reference range applies only to samples taken after fasting for at least 8 hours.   BUN 15 6 - 20 mg/dL   Creatinine, Ser 0.99 0.61 - 1.24 mg/dL   Calcium 9.6 8.9 - 83.3 mg/dL   Total Protein 6.9 6.5 - 8.1 g/dL   Albumin 3.8 3.5 - 5.0 g/dL   AST 36 15 - 41 U/L   ALT 46 (H) 0 - 44 U/L   Alkaline Phosphatase 90 38 - 126 U/L   Total Bilirubin 0.4 0.3 - 1.2 mg/dL   GFR calc non Af Amer >60 >60 mL/min   GFR calc Af Amer >60 >60 mL/min   Anion gap 10 5 - 15    Comment: Performed at [facility], [address] [place], [place]  [address]  CBC with Differential     Status: Abnormal   Collection Time: 04/30/20  2:13 AM  Result Value Ref Range   WBC 12.0 (H) 4.0 - 10.5 K/uL   RBC 4.32 4.22 - 5.81 MIL/uL   Hemoglobin 12.9 (L) 13.0 - 17.0 g/dL   HCT 38.8 (L) 39.0 - 52.0 %   MCV 89.8 80.0 - 100.0 fL   MCH 29.9 26.0 - 34.0 pg   MCHC 33.2 30.0 - 36.0 g/dL   RDW 13.0 11.5 - 15.5 %   Platelets 300 150 - 400 K/uL   nRBC 0.0 0.0 - 0.2 %   Neutrophils Relative % 83 %   Neutro Abs 9.9 (H) 1.7 - 7.7 K/uL   Lymphocytes Relative 9 %   Lymphs Abs 1.1 0.7 - 4.0 K/uL   Monocytes Relative 7 %   Monocytes Absolute 0.8 0.1 - 1.0 K/uL   Eosinophils Relative 1 %   Eosinophils Absolute 0.1 0.0 - 0.5 K/uL   Basophils Relative 0 %   Basophils Absolute 0.1 0.0 - 0.1 K/uL   Immature Granulocytes 0 %   Abs Immature Granulocytes 0.02 0.00 - 0.07  K/uL    Comment: Performed at [facility], [place] [address] [place], [place] [address]  Protime-INR     Status: None   Collection Time: 04/30/20  2:13 AM  Result Value Ref Range   Prothrombin Time 13.7 11.4 - 15.2 seconds   INR 1.1 0.8 - 1.2    Comment: (NOTE) INR goal varies based on device and disease states. Performed at [facility], [place] [address], [place], [place] [address]   Lactic acid, plasma     Status: None   Collection Time: 04/30/20  2:14 AM  Result Value Ref Range   Lactic Acid, Venous 1.9 0.5 - 1.9 mmol/L    Comment: Performed at [facility], [place] [address], [place], [place] [address]  Urinalysis, Routine w reflex microscopic     Status: Abnormal   Collection Time: 04/30/20  2:14 AM  Result Value Ref Range   Color, Urine YELLOW YELLOW   APPearance CLOUDY (A) CLEAR   Specific Gravity, Urine 1.012 1.005 - 1.030   pH 8.0 5.0 - 8.0   Glucose, UA NEGATIVE NEGATIVE mg/dL   Hgb urine dipstick NEGATIVE NEGATIVE   Bilirubin Urine NEGATIVE NEGATIVE   Ketones, ur NEGATIVE NEGATIVE mg/dL   Protein, ur NEGATIVE NEGATIVE mg/dL   Nitrite NEGATIVE NEGATIVE   Leukocytes,Ua NEGATIVE NEGATIVE    Comment: Performed at [facility] [address], [place], [place] [address]  SARS Coronavirus 2 by RT PCR (hospital order, performed in [facility] hospital lab) Nasopharyngeal Nasopharyngeal Swab     Status: None   Collection Time: 04/30/20  9:38 AM   Specimen: Nasopharyngeal Swab  Result Value Ref Range   SARS Coronavirus 2 NEGATIVE NEGATIVE    Comment: (NOTE) SARS-CoV-2 target nucleic acids are NOT DETECTED. The SARS-CoV-2 RNA is generally detectable in upper and lower respiratory specimens during the acute phase of infection. The lowest concentration of SARS-CoV-2 viral copies this assay can detect is 250 copies / mL. A negative result does not preclude SARS-CoV-2 infection and should not be used as the  sole basis for treatment or other patient management decisions.  A negative result may occur with improper specimen collection / handling, submission of specimen other than nasopharyngeal swab, presence of viral mutation(s) within the areas targeted by this assay, and inadequate number  of viral copies (<250 copies / mL). A negative result must be combined with clinical observations, patient history, and epidemiological information. Fact Sheet for Patients:   BoilerBrush.com.cy Fact Sheet for Healthcare Providers: https://pope.com/ This test is not yet approved or cleared  by the [place] FDA and has been authorized for detection and/or diagnosis of SARS-CoV-2 by FDA under an Emergency Use Authorization (EUA).  This EUA will remain in effect (meaning this test can be used) for the duration of the COVID-19 declaration under Section 564(b)(1) of the Act, 21 U.S.C. section 360bbb-3(b)(1), unless the authorization is terminated or revoked sooner. Performed at [facility], [address] [place], [place] [address]    DG Chest 2 View  Result Date: 04/30/2020 CLINICAL DATA:  Suspected sepsis, cough and fever, multiple abscess on the bilateral arms, self attempt at drainage EXAM: CHEST - 2 VIEW COMPARISON:  Radiograph 11/01/2012 FINDINGS: No consolidation, features of edema, pneumothorax, or effusion. Pulmonary vascularity is normally distributed. The cardiomediastinal contours are unremarkable. No acute osseous or soft tissue abnormality. IMPRESSION: No acute cardiopulmonary abnormality. Electronically Signed   By: Kreg Shropshire M.D.   On: 04/30/2020 02:37    Pending Labs Unresulted Labs (From admission, onward)    Start     Ordered   04/30/20 1500  Basic metabolic panel  Once,   STAT     04/30/20 1131   04/30/20 0154  Culture, blood (Routine x 2)  BLOOD CULTURE X 2,   STAT     04/30/20 0153   Signed and Held  HIV  Antibody (routine testing w rflx)  (HIV Antibody (Routine testing w reflex) panel)  Once,   R     Signed and Held          Vitals/Pain Today's Vitals   04/30/20 0730 04/30/20 0747 04/30/20 0748 04/30/20 0909  BP: (!) 167/84   138/69  Pulse: (!) 111   (!) 117  Resp:    15  Temp:      TempSrc:      SpO2: 91%   90%  Weight:      Height:      PainSc:  5  3      Isolation Precautions No active isolations  Medications Medications  vancomycin (VANCOREADY) IVPB 1500 mg/300 mL (1,500 mg Intravenous New Bag/Given 04/30/20 1104)  vancomycin (VANCOREADY) IVPB 1500 mg/300 mL (has no administration in time range)  sodium chloride (PF) 0.9 % injection (has no administration in time range)  lactated ringers infusion (has no administration in time range)  acetaminophen (TYLENOL) tablet 1,000 mg (1,000 mg Oral Given 04/30/20 0226)  vancomycin (VANCOCIN) IVPB 1000 mg/200 mL premix (0 mg Intravenous Stopped 04/30/20 0411)  piperacillin-tazobactam (ZOSYN) IVPB 3.375 g (0 g Intravenous Stopped 04/30/20 0312)  sodium chloride 0.9 % bolus 1,000 mL (0 mLs Intravenous Stopped 04/30/20 0418)  sodium chloride 0.9 % bolus 1,000 mL (0 mLs Intravenous Stopped 04/30/20 0748)  iohexol (OMNIPAQUE) 300 MG/ML solution 100 mL (100 mLs Intravenous Contrast Given 04/30/20 1033)    Mobility walks Low fall risk   Focused Assessments .   R Recommendations: See Admitting Provider Note  Report given to:   Additional Notes: n/a

## 2020-04-30 NOTE — ED Provider Notes (Signed)
[facility]-EM[facility] Provider Note   CSN: [id number] Arrival date & time: 04/30/20  0129     History Chief Complaint  Patient presents with  . Cellulitis    Matthew Gibson is a 44 y.o. male.  Patient to ED with multiple abscesses to bilateral forearms. He reports the abscess to the right forearm started several months ago and he self-drains with a needle and expresses purulent material intermittently. The multiple areas on the left forearm have developed over the last week. He is not aware of any fever, denies nausea or vomiting. He admits to previous history of IVDA but denies any IV drugs in the last several months. He reports he was just released from prison. He also reports pain in his right anterior lower chest after being hit with a heavy object while unloading a truck.   The history is provided by the patient. No language interpreter was used.       Past Medical History:  Diagnosis Date  . ADHD   . Back pain     There are no problems to display for this patient.   Past Surgical History:  Procedure Laterality Date  . NO PAST SURGERIES         Family History  Problem Relation Age of Onset  . Healthy Mother   . Cancer Father   . Hypertension Father   . Stroke Neg Hx     Social History   Tobacco Use  . Smoking status: Current Every Day Smoker  . Smokeless tobacco: Never Used  Substance Use Topics  . Alcohol use: Yes  . Drug use: Yes    Home Medications Prior to Admission medications   Medication Sig Start Date End Date Taking? Authorizing Provider  cetirizine (ZYRTEC) 10 MG tablet Take 1 tablet (10 mg total) by mouth daily. Patient not taking: Reported on 04/30/2020 01/22/19   Bing Neighbors, FNP  fluticasone [facility]) 50 MCG/ACT nasal spray Place 2 sprays into both nostrils daily. Patient not taking: Reported on 04/30/2020 01/22/19   Bing Neighbors, FNP  montelukast (SINGULAIR) 10 MG tablet TAKE 1 TABLET BY MOUTH EVERY  NIGHT AT BEDTIME Patient not taking: Reported on 04/30/2020 05/03/19   Bing Neighbors, FNP    Allergies    Patient has no known allergies.  Review of Systems   Review of Systems  Constitutional: Negative for fever.  HENT: Negative.   Respiratory: Negative for shortness of breath.   Cardiovascular: Positive for chest pain (Right sided chest pain after injury).  Gastrointestinal: Negative for nausea and vomiting.  Skin: Positive for color change and wound.  Neurological: Negative for weakness.    Physical Exam Updated Vital Signs BP (!) 141/81   Pulse (!) 115   Temp (!) 102.9 F (39.4 C) (Oral)   Resp 18   Ht 6\' 1"  (1.854 m)   Wt 81.6 kg   SpO2 95%   BMI 23.75 kg/m   Physical Exam Vitals and nursing note reviewed.  Constitutional:      Appearance: He is well-developed.  HENT:     Head: Normocephalic.  Cardiovascular:     Rate and Rhythm: Regular rhythm. Tachycardia present.  Pulmonary:     Effort: Pulmonary effort is normal.     Breath sounds: Normal breath sounds. No wheezing, rhonchi or rales.  Abdominal:     General: Bowel sounds are normal.     Palpations: Abdomen is soft.     Tenderness: There is no abdominal tenderness.  There is no guarding or rebound.  Musculoskeletal:        General: Swelling and tenderness present. Normal range of motion.     Cervical back: Normal range of motion and neck supple.  Skin:    General: Skin is warm and dry.     Findings: Erythema present.     Comments: Multiple shallow ulcerations to bilateral forearms and left hand with surround erythema and associated swelling. No drainable fluid collections.   Neurological:     Mental Status: He is alert and oriented to person, place, and time.     ED Results / Procedures / Treatments   Labs (all labs ordered are listed, but only abnormal results are displayed) Labs Reviewed  COMPREHENSIVE METABOLIC PANEL - Abnormal; Notable for the following components:      Result Value    Sodium 129 (*)    Chloride 95 (*)    Glucose, Bld 174 (*)    ALT 46 (*)    All other components within normal limits  CBC WITH DIFFERENTIAL/PLATELET - Abnormal; Notable for the following components:   WBC 12.0 (*)    Hemoglobin 12.9 (*)    HCT 38.8 (*)    Neutro Abs 9.9 (*)    All other components within normal limits  URINALYSIS, ROUTINE W REFLEX MICROSCOPIC - Abnormal; Notable for the following components:   APPearance CLOUDY (*)    All other components within normal limits  CULTURE, BLOOD (ROUTINE X 2)  CULTURE, BLOOD (ROUTINE X 2)  LACTIC ACID, PLASMA  PROTIME-INR    EKG None  Radiology DG Chest 2 View  Result Date: 04/30/2020 CLINICAL DATA:  Suspected sepsis, cough and fever, multiple abscess on the bilateral arms, self attempt at drainage EXAM: CHEST - 2 VIEW COMPARISON:  Radiograph 11/01/2012 FINDINGS: No consolidation, features of edema, pneumothorax, or effusion. Pulmonary vascularity is normally distributed. The cardiomediastinal contours are unremarkable. No acute osseous or soft tissue abnormality. IMPRESSION: No acute cardiopulmonary abnormality. Electronically Signed   By: Kreg Shropshire M.D.   On: 04/30/2020 02:37    Procedures Procedures (including critical care time)  Medications Ordered in ED Medications  acetaminophen (TYLENOL) tablet 1,000 mg (1,000 mg Oral Given 04/30/20 0226)  vancomycin (VANCOCIN) IVPB 1000 mg/200 mL premix (1,000 mg Intravenous New Bag/Given 04/30/20 0311)  piperacillin-tazobactam (ZOSYN) IVPB 3.375 g (0 g Intravenous Stopped 04/30/20 0312)  sodium chloride 0.9 % bolus 1,000 mL (0 mLs Intravenous Stopped 04/30/20 0418)    ED Course  I have reviewed the triage vital signs and the nursing notes.  Pertinent labs & imaging results that were available during my care of the patient were reviewed by me and considered in my medical decision making (see chart for details).    MDM Rules/Calculators/A&P                      Patient to ED with  c/o multiple abscesses to forearms bilaterally. He is febrile on arrival to 102.9, tachycardic to 130. He meets SIRS criteria but is overall well appearing, in NAD. Labs pending.   No elevation of lactic acid, mild leukocytosis. IV abx have been provided. He remains tachycardic despite fluid resuscitation. Feel he would benefit from continued IV abx treatment until clinical improvement. This was discussed with the hospitalist, Dr. Antionette Char, who accepts the patient for admission.    Final Clinical Impression(s) / ED Diagnoses Final diagnoses:  None   1. Abscess, multiple 2. Cellulitis   Rx / DC Orders ED  Discharge Orders    None       Charlann Lange, PA-C 04/30/20 [place], Delice Bison, DO 04/30/20 2314

## 2020-04-30 NOTE — Consult Note (Signed)
Reason for Consult:IV drug injector UE with celluitis ? Surgery needed.  Referring Physician: Frederick Peers MD  Matthew Gibson is an 44 y.o. male.  HPI: 44 year old male IV drug abuser reports she has not injected in about 2 months had problems with draining pus she has been trying to pack and drain it.  Problems with right forearm ulcer and cellulitis dorsally and also left wrist and left hand.  Patient relates he has been poking a needle trying to drain areas.  White blood count 12,000 on admission..  Low sodium at 129 glucose 174 elevated and ALT 46 other liver enzymes were normal.  Patient had increased subcutaneous fluid swelling and erythema on his wrist and presented to the ED.  Patient relates that right forearm is been present for several months with drainage sometimes self draining with a needle.  Left forearm wrist and hand were more recent.  Past Medical History:  Diagnosis Date  . ADHD   . Back pain     Past Surgical History:  Procedure Laterality Date  . NO PAST SURGERIES      Family History  Problem Relation Age of Onset  . Healthy Mother   . Cancer Father   . Hypertension Father   . Stroke Neg Hx     Social History:  reports that he has been smoking. He has never used smokeless tobacco. He reports current alcohol use. He reports current drug use.  Allergies: No Known Allergies  Medications: I have reviewed the patient's current medications.  Results for orders placed or performed during the hospital encounter of 04/30/20 (from the past 48 hour(s))  Comprehensive metabolic panel     Status: Abnormal   Collection Time: 04/30/20  2:13 AM  Result Value Ref Range   Sodium 129 (L) 135 - 145 mmol/L   Potassium 4.1 3.5 - 5.1 mmol/L   Chloride 95 (L) 98 - 111 mmol/L   CO2 24 22 - 32 mmol/L   Glucose, Bld 174 (H) 70 - 99 mg/dL    Comment: Glucose reference range applies only to samples taken after fasting for at least 8 hours.   BUN 15 6 - 20 mg/dL   Creatinine, Ser  8.65 0.61 - 1.24 mg/dL   Calcium 9.6 8.9 - 78.4 mg/dL   Total Protein 6.9 6.5 - 8.1 g/dL   Albumin 3.8 3.5 - 5.0 g/dL   AST 36 15 - 41 U/L   ALT 46 (H) 0 - 44 U/L   Alkaline Phosphatase 90 38 - 126 U/L   Total Bilirubin 0.4 0.3 - 1.2 mg/dL   GFR calc non Af Amer >60 >60 mL/min   GFR calc Af Amer >60 >60 mL/min   Anion gap 10 5 - 15    Comment: Performed at [facility], [address] [place], [place] [address]  CBC with Differential     Status: Abnormal   Collection Time: 04/30/20  2:13 AM  Result Value Ref Range   WBC 12.0 (H) 4.0 - 10.5 K/uL   RBC 4.32 4.22 - 5.81 MIL/uL   Hemoglobin 12.9 (L) 13.0 - 17.0 g/dL   HCT 52.8 (L) 41.3 - 24.4 %   MCV 89.8 80.0 - 100.0 fL   MCH 29.9 26.0 - 34.0 pg   MCHC 33.2 30.0 - 36.0 g/dL   RDW 01.0 27.2 - 53.6 %   Platelets 300 150 - 400 K/uL   nRBC 0.0 0.0 - 0.2 %   Neutrophils Relative % 83 %  Neutro Abs 9.9 (H) 1.7 - 7.7 K/uL   Lymphocytes Relative 9 %   Lymphs Abs 1.1 0.7 - 4.0 K/uL   Monocytes Relative 7 %   Monocytes Absolute 0.8 0.1 - 1.0 K/uL   Eosinophils Relative 1 %   Eosinophils Absolute 0.1 0.0 - 0.5 K/uL   Basophils Relative 0 %   Basophils Absolute 0.1 0.0 - 0.1 K/uL   Immature Granulocytes 0 %   Abs Immature Granulocytes 0.02 0.00 - 0.07 K/uL    Comment: Performed at [facility], [place] [address] [place], [place] [address]  Protime-INR     Status: None   Collection Time: 04/30/20  2:13 AM  Result Value Ref Range   Prothrombin Time 13.7 11.4 - 15.2 seconds   INR 1.1 0.8 - 1.2    Comment: (NOTE) INR goal varies based on device and disease states. Performed at [facility], [place] [address] [place], [place] [address]   Lactic acid, plasma     Status: None   Collection Time: 04/30/20  2:14 AM  Result Value Ref Range   Lactic Acid, Venous 1.9 0.5 - 1.9 mmol/L    Comment: Performed at [facility], [place] [address], [place], [place] [address]    Urinalysis, Routine w reflex microscopic     Status: Abnormal   Collection Time: 04/30/20  2:14 AM  Result Value Ref Range   Color, Urine YELLOW YELLOW   APPearance CLOUDY (A) CLEAR   Specific Gravity, Urine 1.012 1.005 - 1.030   pH 8.0 5.0 - 8.0   Glucose, UA NEGATIVE NEGATIVE mg/dL   Hgb urine dipstick NEGATIVE NEGATIVE   Bilirubin Urine NEGATIVE NEGATIVE   Ketones, ur NEGATIVE NEGATIVE mg/dL   Protein, ur NEGATIVE NEGATIVE mg/dL   Nitrite NEGATIVE NEGATIVE   Leukocytes,Ua NEGATIVE NEGATIVE    Comment: Performed at [place] [address] [place], [place] [address]  SARS Coronavirus 2 by RT PCR (hospital order, performed in [facility] hospital lab) Nasopharyngeal Nasopharyngeal Swab     Status: None   Collection Time: 04/30/20  9:38 AM   Specimen: Nasopharyngeal Swab  Result Value Ref Range   SARS Coronavirus 2 NEGATIVE NEGATIVE    Comment: (NOTE) SARS-CoV-2 target nucleic acids are NOT DETECTED. The SARS-CoV-2 RNA is generally detectable in upper and lower respiratory specimens during the acute phase of infection. The lowest concentration of SARS-CoV-2 viral copies this assay can detect is 250 copies / mL. A negative result does not preclude SARS-CoV-2 infection and should not be used as the sole basis for treatment or other patient management decisions.  A negative result may occur with improper specimen collection / handling, submission of specimen other than nasopharyngeal swab, presence of viral mutation(s) within the areas targeted by this assay, and inadequate number of viral copies (<250 copies / mL). A negative result must be combined with clinical observations, patient history, and epidemiological information. Fact Sheet for Patients:   StrictlyIdeas.no Fact Sheet for Healthcare Providers: BankingDealers.co.za This test is not yet approved or cleared  by the [place] FDA and has been  authorized for detection and/or diagnosis of SARS-CoV-2 by FDA under an Emergency Use Authorization (EUA).  This EUA will remain in effect (meaning this test can be used) for the duration of the COVID-19 declaration under Section 564(b)(1) of the Act, 21 U.S.C. section 360bbb-3(b)(1), unless the authorization is terminated or revoked sooner. Performed at [facility], [address] [place],  [place] [address]   Basic metabolic panel     Status: Abnormal   Collection Time: 04/30/20  3:16 PM  Result Value Ref Range   Sodium 133 (L) 135 - 145 mmol/L   Potassium 4.2 3.5 - 5.1 mmol/L   Chloride 99 98 - 111 mmol/L   CO2 26 22 - 32 mmol/L   Glucose, Bld 135 (H) 70 - 99 mg/dL    Comment: Glucose reference range applies only to samples taken after fasting for at least 8 hours.   BUN 12 6 - 20 mg/dL   Creatinine, Ser 8.29 0.61 - 1.24 mg/dL   Calcium 8.4 (L) 8.9 - 10.3 mg/dL   GFR calc non Af Amer >60 >60 mL/min   GFR calc Af Amer >60 >60 mL/min   Anion gap 8 5 - 15    Comment: Performed at [facility], [address] [place], [place] [address]    DG Chest 2 View  Result Date: 04/30/2020 CLINICAL DATA:  Suspected sepsis, cough and fever, multiple abscess on the bilateral arms, self attempt at drainage EXAM: CHEST - 2 VIEW COMPARISON:  Radiograph 11/01/2012 FINDINGS: No consolidation, features of edema, pneumothorax, or effusion. Pulmonary vascularity is normally distributed. The cardiomediastinal contours are unremarkable. No acute osseous or soft tissue abnormality. IMPRESSION: No acute cardiopulmonary abnormality. Electronically Signed   By: Kreg Shropshire M.D.   On: 04/30/2020 02:37   CT Extrem Up Entire Arm L W/CM  Result Date: 04/30/2020 CLINICAL DATA:  Multiple bilateral forearm abscesses for several months. History intravenous drug abuse. EXAM: CT OF THE UPPER LEFT EXTREMITY WITH CONTRAST CT OF THE UPPER RIGHT EXTREMITY WITH CONTRAST TECHNIQUE:  Multidetector CT imaging of the both upper extremities was performed according to the standard protocol following intravenous contrast administration. In order to image both upper extremities with contrast, the examination was performed with the patient supine and his arms elevated over his head. The images extend from the shoulders through the hands bilaterally. CONTRAST:  OMNIPAQUE IOHEXOL 300 MG/ML  SOLN COMPARISON:  None. FINDINGS: Bones/Joint/Cartilage No significant osseous findings are seen in either upper extremity. The humerus, radius and ulna appear normal bilaterally. There is no significant arthropathy or joint effusion. No evidence of bone destruction. Ligaments Suboptimally assessed by CT. Muscles and Tendons No focal muscular abnormality or abnormal enhancement identified in either upper extremity. No tendon disruption or tenosynovitis seen. Soft tissues On the right, there is subcutaneous edema within the dorsal and radial aspect of the mid to distal forearm. No focal fluid collection, foreign body or soft tissue emphysema seen within the right upper extremity. On the left, there is dorsal subcutaneous edema in the distal forearm and hand. There are small air bubbles in the dorsum of the hand. No focal fluid collection, foreign body or definite superficial thrombophlebitis identified. At the left elbow, there are 2 mildly prominent lymph nodes which are probable reactive epitrochlear nodes. IMPRESSION: 1. Subcutaneous edema in the right forearm and distal left forearm and dorsum of the hand, consistent with cellulitis. There are small air bubbles in the subcutaneous tissues of the dorsum of the left hand, which may relate to infection. No focal fluid collection, foreign body or definite superficial thrombophlebitis identified. 2. No evidence of osteomyelitis or septic joint. 3. Probable reactive epitrochlear lymph nodes at the left elbow. Electronically Signed   By: Carey Bullocks M.D.   On:  04/30/2020 11:53   CT Extrem Up Entire Arm R W/CM  Result Date: 04/30/2020 CLINICAL  DATA:  Multiple bilateral forearm abscesses for several months. History intravenous drug abuse. EXAM: CT OF THE UPPER LEFT EXTREMITY WITH CONTRAST CT OF THE UPPER RIGHT EXTREMITY WITH CONTRAST TECHNIQUE: Multidetector CT imaging of the both upper extremities was performed according to the standard protocol following intravenous contrast administration. In order to image both upper extremities with contrast, the examination was performed with the patient supine and his arms elevated over his head. The images extend from the shoulders through the hands bilaterally. CONTRAST:  OMNIPAQUE IOHEXOL 300 MG/ML  SOLN COMPARISON:  None. FINDINGS: Bones/Joint/Cartilage No significant osseous findings are seen in either upper extremity. The humerus, radius and ulna appear normal bilaterally. There is no significant arthropathy or joint effusion. No evidence of bone destruction. Ligaments Suboptimally assessed by CT. Muscles and Tendons No focal muscular abnormality or abnormal enhancement identified in either upper extremity. No tendon disruption or tenosynovitis seen. Soft tissues On the right, there is subcutaneous edema within the dorsal and radial aspect of the mid to distal forearm. No focal fluid collection, foreign body or soft tissue emphysema seen within the right upper extremity. On the left, there is dorsal subcutaneous edema in the distal forearm and hand. There are small air bubbles in the dorsum of the hand. No focal fluid collection, foreign body or definite superficial thrombophlebitis identified. At the left elbow, there are 2 mildly prominent lymph nodes which are probable reactive epitrochlear nodes. IMPRESSION: 1. Subcutaneous edema in the right forearm and distal left forearm and dorsum of the hand, consistent with cellulitis. There are small air bubbles in the subcutaneous tissues of the dorsum of the left hand,  which may relate to infection. No focal fluid collection, foreign body or definite superficial thrombophlebitis identified. 2. No evidence of osteomyelitis or septic joint. 3. Probable reactive epitrochlear lymph nodes at the left elbow. Electronically Signed   By: Carey Bullocks M.D.   On: 04/30/2020 11:53    ROS noncontributory as pertains to HPI other than as mentioned above. Blood pressure 113/67, pulse (!) 105, temperature 99.1 F (37.3 C), temperature source Oral, resp. rate 15, height 6\' 1"  (1.854 m), weight 81.6 kg, SpO2 96 %. Physical Exam  Constitutional: He is oriented to person, place, and time. He appears well-developed.  HENT:  Head: Normocephalic.  Eyes: Pupils are equal, round, and reactive to light. EOM are normal.  Cardiovascular: Normal rate.  Respiratory: Effort normal.  GI: Soft.  Musculoskeletal:     Cervical back: Normal range of motion.  Neurological: He is alert and oriented to person, place, and time.  Skin: There is erythema.  Psychiatric: He has a normal mood and affect.  Right forearm as lesion scabbed over eschar with erythema and cellulitis.  Has extension of the fingertips.  Distal left wrist and dorsal hand is edematous with erythema cellulitis.  Dorsum hand area left hand with eschar area.  Assessment/Plan: CT scan images reviewed.  Patient has no evidence of myositis no definite abscesses.  He does have cellulitis on the dorsum of the wrist and hand on the left and right dorsal forearm and areas that he is previously injected and has been trying to drain with a needle.  Agree with IV antibiotic treatment elevation.  No surgery indicated at this point.    04/30/2020, 6:53 PM

## 2020-04-30 NOTE — H&P (Signed)
History and Physical    Matthew Gibson  YHC:[id number]  DOB: 03/03/1976  DOA: 04/30/2020  PCP: Scot Jun, FNP Patient coming from: home  Chief Complaint: arm/hand swelling and draining pus  I have personally briefly reviewed patient's old medical records in [place]  HPI:  Matthew Gibson is a 44 yo CM with PMH IVDA (last use of meth approx 2 months ago, injects into forearms bilaterally). Patient states that right forearm had been draining pus spontaneously over the past couple months.  At home he was using a syringe and needle to draw fluid out.  He states he did clean the needle before hand but not the skin.  This has improved the swelling and drainage some but still persists with a chronic appearing wound at the distal forearm.  Approximately 2 days ago he also began developing swelling, pain, redness in his left lower forearm as well.  He has swelling down into his left hand as well. He presented to the hospital for further work-up and evaluation. Temp in the ER, 102.9, heart rate 130, respirations 20, BP 165/79, 97% room air WBC 12.  Lactic 1.9 Blood cultures were obtained in the ER Patient received vancomycin and Zosyn x1   Assessment/Plan: Cellulitis and abscess of upper extremity - suspect etiology is semi-recent IVDA (meth into right forearm approx 2 months ago).  Since then, patient has been manually draining the abscess with a needle at home.  Aseptic technique was not properly used each time.  He then developed left forearm cellulitis with abscess approximately 2 days ago.  Extensive counseling held bedside regarding need for cessation of injecting drugs and increased risk of systemic infections including endocarditis and at worst, death. -Obtain CT of bilateral forearms for further evaluation -Orthopedic surgery consult. Appreciate assistance (spoke with Dr. Lorin Mercy). Patient does consent to remaining in the hospital for now -Tylenol and oxycodone as needed for  pain control -Vancomycin per pharmacy -Follow-up any cultures and will de-escalate and/or change therapy as needed  Hyponatremia -Suspect hypovolemic hyponatremia at this time.  He appears slightly volume depleted/dehydrated on exam -Sodium 129 on admission -Start on LR and repeat BMP later this afternoon  IV drug abuse ([place]) - patient endorses meth injections into arms typically; reports last use ~2 months ago just prior to right forearm abscess formation - treat pain appropriately but caution with escalation - cessation counseling given bedside     Code Status:  Full DVT Prophylaxis:SCD Anticipated disposition is to home  History: Past Medical History:  Diagnosis Date  . ADHD   . Back pain     Past Surgical History:  Procedure Laterality Date  . NO PAST SURGERIES       reports that he has been smoking. He has never used smokeless tobacco. He reports current alcohol use. He reports current drug use.  No Known Allergies  Family History  Problem Relation Age of Onset  . Healthy Mother   . Cancer Father   . Hypertension Father   . Stroke Neg Hx    Home Medications: Prior to Admission medications   Medication Sig Start Date End Date Taking? Authorizing Provider  cetirizine (ZYRTEC) 10 MG tablet Take 1 tablet (10 mg total) by mouth daily. Patient not taking: Reported on 04/30/2020 01/22/19   Scot Jun, FNP  fluticasone [facility]) 50 MCG/ACT nasal spray Place 2 sprays into both nostrils daily. Patient not taking: Reported on 04/30/2020 01/22/19   Scot Jun, FNP  montelukast (SINGULAIR) 10  MG tablet TAKE 1 TABLET BY MOUTH EVERY NIGHT AT BEDTIME Patient not taking: Reported on 04/30/2020 05/03/19   Bing Neighbors, FNP    Review of Systems:  Pertinent items noted in HPI and remainder of comprehensive ROS otherwise negative.  Physical Exam: Vitals:   04/30/20 0600 04/30/20 0630 04/30/20 0730 04/30/20 0909  BP: (!) 145/73 (!) 149/86 (!) 167/84 138/69   Pulse: (!) 101 (!) 107 (!) 111 (!) 117  Resp:    15  Temp:      TempSrc:      SpO2: 95% 93% 91% 90%  Weight:      Height:       General appearance: alert, cooperative and no distress Head: Normocephalic, without obvious abnormality Eyes: EOMI Lungs: clear to auscultation bilaterally Heart: regular rate and rhythm, S1, S2 normal and no appreciated murmurs Abdomen: normal findings: bowel sounds normal and soft, non-tender Extremities: right distal forearm noted with lesion scabbed over and forearm is mildly edematous, TTP, mild erythema. Left distal forearm to wrist is more edematous with worse erythema, calor, TTP and swelling extends to hand  Skin: erythema noted on bilateral forearms, L>R Neurologic: Grossly normal  Labs on Admission:  I have personally reviewed following labs and imaging studies Results for orders placed or performed during the hospital encounter of 04/30/20 (from the past 24 hour(s))  Comprehensive metabolic panel     Status: Abnormal   Collection Time: 04/30/20  2:13 AM  Result Value Ref Range   Sodium 129 (L) 135 - 145 mmol/L   Potassium 4.1 3.5 - 5.1 mmol/L   Chloride 95 (L) 98 - 111 mmol/L   CO2 24 22 - 32 mmol/L   Glucose, Bld 174 (H) 70 - 99 mg/dL   BUN 15 6 - 20 mg/dL   Creatinine, Ser 1.66 0.61 - 1.24 mg/dL   Calcium 9.6 8.9 - 06.3 mg/dL   Total Protein 6.9 6.5 - 8.1 g/dL   Albumin 3.8 3.5 - 5.0 g/dL   AST 36 15 - 41 U/L   ALT 46 (H) 0 - 44 U/L   Alkaline Phosphatase 90 38 - 126 U/L   Total Bilirubin 0.4 0.3 - 1.2 mg/dL   GFR calc non Af Amer >60 >60 mL/min   GFR calc Af Amer >60 >60 mL/min   Anion gap 10 5 - 15  CBC with Differential     Status: Abnormal   Collection Time: 04/30/20  2:13 AM  Result Value Ref Range   WBC 12.0 (H) 4.0 - 10.5 K/uL   RBC 4.32 4.22 - 5.81 MIL/uL   Hemoglobin 12.9 (L) 13.0 - 17.0 g/dL   HCT 01.6 (L) 01.0 - 93.2 %   MCV 89.8 80.0 - 100.0 fL   MCH 29.9 26.0 - 34.0 pg   MCHC 33.2 30.0 - 36.0 g/dL   RDW 35.5  73.2 - 20.2 %   Platelets 300 150 - 400 K/uL   nRBC 0.0 0.0 - 0.2 %   Neutrophils Relative % 83 %   Neutro Abs 9.9 (H) 1.7 - 7.7 K/uL   Lymphocytes Relative 9 %   Lymphs Abs 1.1 0.7 - 4.0 K/uL   Monocytes Relative 7 %   Monocytes Absolute 0.8 0.1 - 1.0 K/uL   Eosinophils Relative 1 %   Eosinophils Absolute 0.1 0.0 - 0.5 K/uL   Basophils Relative 0 %   Basophils Absolute 0.1 0.0 - 0.1 K/uL   Immature Granulocytes 0 %   Abs Immature Granulocytes 0.02 0.00 -  0.07 K/uL  Protime-INR     Status: None   Collection Time: 04/30/20  2:13 AM  Result Value Ref Range   Prothrombin Time 13.7 11.4 - 15.2 seconds   INR 1.1 0.8 - 1.2  Lactic acid, plasma     Status: None   Collection Time: 04/30/20  2:14 AM  Result Value Ref Range   Lactic Acid, Venous 1.9 0.5 - 1.9 mmol/L  Urinalysis, Routine w reflex microscopic     Status: Abnormal   Collection Time: 04/30/20  2:14 AM  Result Value Ref Range   Color, Urine YELLOW YELLOW   APPearance CLOUDY (A) CLEAR   Specific Gravity, Urine 1.012 1.005 - 1.030   pH 8.0 5.0 - 8.0   Glucose, UA NEGATIVE NEGATIVE mg/dL   Hgb urine dipstick NEGATIVE NEGATIVE   Bilirubin Urine NEGATIVE NEGATIVE   Ketones, ur NEGATIVE NEGATIVE mg/dL   Protein, ur NEGATIVE NEGATIVE mg/dL   Nitrite NEGATIVE NEGATIVE   Leukocytes,Ua NEGATIVE NEGATIVE  SARS Coronavirus 2 by RT PCR (hospital order, performed in [facility] hospital lab) Nasopharyngeal Nasopharyngeal Swab     Status: None   Collection Time: 04/30/20  9:38 AM   Specimen: Nasopharyngeal Swab  Result Value Ref Range   SARS Coronavirus 2 NEGATIVE NEGATIVE     Radiological Exams on Admission: DG Chest 2 View  Result Date: 04/30/2020 CLINICAL DATA:  Suspected sepsis, cough and fever, multiple abscess on the bilateral arms, self attempt at drainage EXAM: CHEST - 2 VIEW COMPARISON:  Radiograph 11/01/2012 FINDINGS: No consolidation, features of edema, pneumothorax, or effusion. Pulmonary vascularity is normally  distributed. The cardiomediastinal contours are unremarkable. No acute osseous or soft tissue abnormality. IMPRESSION: No acute cardiopulmonary abnormality. Electronically Signed   By: Kreg Shropshire M.D.   On: 04/30/2020 02:37   DG Chest 2 View  Final Result    CT Extrem Up Entire Arm L W/CM    (Results Pending)  CT Extrem Up Entire Arm R W/CM    (Results Pending)    Consults called:  Orthopedic surgery   EKG:     Lewie Chamber, MD [facility] Pager: Secure chat via Amion Or pager # : [phone]  If 7PM-7AM, please contact night-coverage [email or web address] Use universal [place] password for that web site. If you do not have the password, please call the hospital operator.  04/30/2020, 11:34 AM

## 2020-04-30 NOTE — Hospital Course (Addendum)
Matthew Gibson is a 44 yo CM with PMH IVDA (last use of meth approx 2 months ago, injects into forearms bilaterally). Patient states that right forearm had been draining pus spontaneously over the past couple months.  At home he was using a syringe and needle to draw fluid out.  He states he did clean the needle before hand but not the skin.  This has improved the swelling and drainage some but still persists with a chronic appearing wound at the distal forearm.  Approximately 2 days ago he also began developing swelling, pain, redness in his left lower forearm as well.  He has swelling down into his left hand as well. He presented to the hospital for further work-up and evaluation. Temp in the ER, 102.9, heart rate 130, respirations 20, BP 165/79, 97% room air WBC 12.  Lactic 1.9 Blood cultures were obtained in the ER Patient received vancomycin and Zosyn x1

## 2020-04-30 NOTE — Progress Notes (Signed)
Pharmacy Antibiotic Note  Matthew Gibson is a 44 y.o. male admitted on 04/30/2020 with purulent BUE abscesses/ cellulitis that he has been self-draining.  Pharmacy has been consulted for Vancomycin dosing.  Febrile, mild leukocytosis CrCl>160ml/min  Plan: Vancomycin 1500mg  IV q12h  Monitor renal function and cx data   Height: 6\' 1"  (185.4 cm) Weight: 81.6 kg (180 lb) IBW/kg (Calculated) : 79.9  Temp (24hrs), Avg:101.5 F (38.6 C), Min:100 F (37.8 C), Max:102.9 F (39.4 C)  Recent Labs  Lab 04/30/20 0213 04/30/20 0214  WBC 12.0*  --   CREATININE 0.91  --   LATICACIDVEN  --  1.9    Estimated Creatinine Clearance: 120.7 mL/min (by C-G formula based on SCr of 0.91 mg/dL).    No Known Allergies  Antimicrobials this admission: 5/22 Vanc >>  5/22 Zosyn x1 in ED  Dose adjustments this admission:  Microbiology results: 5/22 BCx:   Thank you for allowing pharmacy to be a part of this patient's care.  6/22, PharmD, BCPS 04/30/2020 9:58 AM

## 2020-04-30 NOTE — Assessment & Plan Note (Signed)
-   patient endorses meth injections into arms typically; reports last use ~2 months ago just prior to right forearm abscess formation - treat pain appropriately but caution with escalation - cessation counseling given bedside

## 2020-04-30 NOTE — ED Triage Notes (Signed)
Patient presents with multiple abscesses on bialteral arms. Patient recently got out of prison, last IVDU 4-6 months ago. Patient states it started as one abscess that he attempted to drain himself multiple times and now he has spots on both arms.

## 2020-04-30 NOTE — Assessment & Plan Note (Signed)
-  Suspect hypovolemic hyponatremia at this time.  He appears slightly volume depleted/dehydrated on exam -Sodium 129 on admission -Start on LR and repeat BMP later this afternoon

## 2020-04-30 NOTE — Assessment & Plan Note (Signed)
-   suspect etiology is semi-recent IVDA (meth into right forearm approx 2 months ago).  Since then, patient has been manually draining the abscess with a needle at home.  Aseptic technique was not properly used each time.  He then developed left forearm cellulitis with abscess approximately 2 days ago.  Extensive counseling held bedside regarding need for cessation of injecting drugs and increased risk of systemic infections including endocarditis and at worst, death. -Obtain CT of bilateral forearms for further evaluation -Orthopedic surgery consult. Appreciate assistance (spoke with Dr. Ophelia Charter). Patient does consent to remaining in the hospital for now -Tylenol and oxycodone as needed for pain control -Vancomycin per pharmacy -Follow-up any cultures and will de-escalate and/or change therapy as needed

## 2020-05-01 MED ORDER — IBUPROFEN 200 MG PO TABS
400.0000 mg | ORAL_TABLET | Freq: Four times a day (QID) | ORAL | Status: DC | PRN
  Administered 2020-05-01: 400 mg via ORAL
  Filled 2020-05-01: qty 2

## 2020-05-01 MED ORDER — LORAZEPAM 1 MG PO TABS
1.0000 mg | ORAL_TABLET | ORAL | Status: DC | PRN
  Administered 2020-05-01 (×2): 1 mg via ORAL
  Filled 2020-05-01 (×2): qty 1

## 2020-05-01 NOTE — Progress Notes (Signed)
PROGRESS NOTE    Matthew Gibson  NUU:[id number] DOB: 18-Jan-1976 DOA: 04/30/2020 PCP: Scot Jun, FNP    Brief Narrative:  Patient admitted to the hospital with a working diagnosis of right upper extremity and left wrist cellulitis.  44 year old male with history of IV drug abuse.  Reports draining pus from a right forearm skin lesion.  Apparently he was using a syringe to draw fluid and drain his wound.  48 hours prior to hospitalization his wound worsening with increased pain, edema, and erythema.  On his initial physical examination he was febrile 102.9, tachycardic 130s beats per minute, respiratory rate 20, blood pressure 165/79, oxygen saturation 97% on room air.  His lungs are clear to auscultation bilaterally, heart S1-S2 present and rhythmic, soft abdomen with no lower extremity edema.  Right distal forearm had a scabbed lesion, positive erythema, tender to palpation and decreased range of motion.  Left distal forearm to the wrist with edema, erythema, and tender to palpation. Sodium 129, potassium 4.1, chloride 95, bicarb 24, glucose 174, BUN 15, creatinine 0.91, white count 12.0, hemoglobin 12.9, hematocrit 38.8, platelets 300.  SARS COVID-19 negative. CT upper extremities with subcutaneous edema in the right forearm and distal left forearm, dorsum of the hand.  No signs of abscess.   Assessment & Plan:   Principal Problem:   Cellulitis and abscess of upper extremity Active Problems:   Hyponatremia   IV drug abuse ([place])   1. Bilateral upper extremities cellulitis. Rash clinically improving but not yet resolved, CT with no deep abscess.   Will continue IV antibiotic therapy with IV vancomycin, will continue pain control with acetaminophen, oxycodone and ibuprofen. Follow up on cultures, cell count and temperature curve.   2. Hyponatremia. Hypovolemic hyponatremia. Serum Na up to 133 with K at 4,2 and serum bicarbonate at 26. Patient is tolerating po well.  Will hold on  IV fluids and will follow up on renal function and electrolytes in am.   3. Anxiety/ substance abuse and tobacco dependence. Will add as needed lorazepam for anxiety. Continue neuro checks per unit protocol. No signs of acute withdrawal. Nicotine patch.   4. Headache. Will add as needed ibuprofen,.   Status is: Inpatient  Remains inpatient appropriate because:IV treatments appropriate due to intensity of illness or inability to take PO   Dispo: The patient is from: Home              Anticipated d/c is to: Home              Anticipated d/c date is: 1 day              Patient currently is not medically stable to d/c.    DVT prophylaxis: Enoxaparin   Code Status:    full  Family Communication:  No family at the bedside    Consultants:   Orthopedics      Antimicrobials:   Vancomycin     Subjective: Patient continue to have pain upper extremities bilaterally with positive erythema. Mild improvement but not yet resolved. Patient has anxiety episodes and headache.   Objective: Vitals:   04/30/20 1622 04/30/20 1951 05/01/20 0117 05/01/20 0200  BP: 113/67 127/69 129/67   Pulse: (!) 105 (!) 108 (!) 104 (!) 107  Resp:  20 16   Temp: 99.1 F (37.3 C) 99.3 F (37.4 C) 100.3 F (37.9 C)   TempSrc: Oral Oral Oral   SpO2: 96%  94%   Weight:  Height:        Intake/Output Summary (Last 24 hours) at 05/01/2020 1326 Last data filed at 05/01/2020 0437 Gross per 24 hour  Intake 1972.18 ml  Output --  Net 1972.18 ml   Filed Weights   04/30/20 0228  Weight: 81.6 kg    Examination:   General: Not in pain or dyspnea.  Neurology: Awake and alert, non focal  E ENT: no pallor, no icterus, oral mucosa moist Cardiovascular: No JVD. S1-S2 present, rhythmic, no gallops, rubs, or murmurs. No lower extremity edema. Pulmonary: vesicular breath sounds bilaterally, adequate air movement, no wheezing, rhonchi or rales. Gastrointestinal. Abdomen with no organomegaly, non tender, no  rebound or guarding Skin. Right forearm erythema, mainly medially, positive right wrist erythema and edema, dressing in place.  Musculoskeletal: no joint deformities     Data Reviewed: I have personally reviewed following labs and imaging studies  CBC: Recent Labs  Lab 04/30/20 0213  WBC 12.0*  NEUTROABS 9.9*  HGB 12.9*  HCT 38.8*  MCV 89.8  PLT 300   Basic Metabolic Panel: Recent Labs  Lab 04/30/20 0213 04/30/20 1516  NA 129* 133*  K 4.1 4.2  CL 95* 99  CO2 24 26  GLUCOSE 174* 135*  BUN 15 12  CREATININE 0.91 1.01  CALCIUM 9.6 8.4*   GFR: Estimated Creatinine Clearance: 108.8 mL/min (by C-G formula based on SCr of 1.01 mg/dL). Liver Function Tests: Recent Labs  Lab 04/30/20 0213  AST 36  ALT 46*  ALKPHOS 90  BILITOT 0.4  PROT 6.9  ALBUMIN 3.8   No results for input(s): LIPASE, AMYLASE in the last 168 hours. No results for input(s): AMMONIA in the last 168 hours. Coagulation Profile: Recent Labs  Lab 04/30/20 0213  INR 1.1   Cardiac Enzymes: No results for input(s): CKTOTAL, CKMB, CKMBINDEX, TROPONINI in the last 168 hours. BNP (last 3 results) No results for input(s): PROBNP in the last 8760 hours. HbA1C: No results for input(s): HGBA1C in the last 72 hours. CBG: No results for input(s): GLUCAP in the last 168 hours. Lipid Profile: No results for input(s): CHOL, HDL, LDLCALC, TRIG, CHOLHDL, LDLDIRECT in the last 72 hours. Thyroid Function Tests: No results for input(s): TSH, T4TOTAL, FREET4, T3FREE, THYROIDAB in the last 72 hours. Anemia Panel: No results for input(s): VITAMINB12, FOLATE, FERRITIN, TIBC, IRON, RETICCTPCT in the last 72 hours.    Radiology Studies: I have reviewed all of the imaging during this hospital visit personally     Scheduled Meds: . nicotine  21 mg Transdermal Daily  . pantoprazole  40 mg Oral Daily   Continuous Infusions: . lactated ringers 75 mL/hr at 05/01/20 0437  . vancomycin 1,500 mg (05/01/20 1000)      LOS: 1 day        [name] Annett Gula, MD

## 2020-05-01 NOTE — Progress Notes (Signed)
Pt. Can be very anxious and agitated. Heart rate increased as high as 140's during ambulation and as low as 95 at rest.

## 2020-05-02 LAB — BASIC METABOLIC PANEL
Anion gap: 8 (ref 5–15)
BUN: 16 mg/dL (ref 6–20)
CO2: 24 mmol/L (ref 22–32)
Calcium: 7.8 mg/dL — ABNORMAL LOW (ref 8.9–10.3)
Chloride: 104 mmol/L (ref 98–111)
Creatinine, Ser: 0.78 mg/dL (ref 0.61–1.24)
GFR calc Af Amer: >60 mL/min (ref >60)
GFR calc non Af Amer: >60 mL/min (ref >60)
Glucose, Bld: 129 mg/dL — ABNORMAL HIGH (ref 70–99)
Potassium: 4 mmol/L (ref 3.5–5.1)
Sodium: 136 mmol/L (ref 135–145)

## 2020-05-02 LAB — CBC WITH DIFFERENTIAL/PLATELET
Abs Immature Granulocytes: 0.09 10*3/uL — ABNORMAL HIGH (ref 0.00–0.07)
Basophils Absolute: 0 10*3/uL (ref 0.0–0.1)
Basophils Relative: 0 %
Eosinophils Absolute: 0.6 10*3/uL — ABNORMAL HIGH (ref 0.0–0.5)
Eosinophils Relative: 5 %
HCT: 36.1 % — ABNORMAL LOW (ref 39.0–52.0)
Hemoglobin: 11.8 g/dL — ABNORMAL LOW (ref 13.0–17.0)
Immature Granulocytes: 1 %
Lymphocytes Relative: 10 %
Lymphs Abs: 1.3 10*3/uL (ref 0.7–4.0)
MCH: 29.2 pg (ref 26.0–34.0)
MCHC: 32.7 g/dL (ref 30.0–36.0)
MCV: 89.4 fL (ref 80.0–100.0)
Monocytes Absolute: 1.1 10*3/uL — ABNORMAL HIGH (ref 0.1–1.0)
Monocytes Relative: 8 %
Neutro Abs: 9.9 10*3/uL — ABNORMAL HIGH (ref 1.7–7.7)
Neutrophils Relative %: 76 %
Platelets: 284 10*3/uL (ref 150–400)
RBC: 4.04 MIL/uL — ABNORMAL LOW (ref 4.22–5.81)
RDW: 13.1 % (ref 11.5–15.5)
WBC: 13 10*3/uL — ABNORMAL HIGH (ref 4.0–10.5)
nRBC: 0 % (ref 0.0–0.2)

## 2020-05-02 MED ORDER — PIPERACILLIN-TAZOBACTAM 3.375 G IVPB 30 MIN
3.3750 g | Freq: Once | INTRAVENOUS | Status: AC
  Administered 2020-05-02: 3.375 g via INTRAVENOUS
  Filled 2020-05-02 (×2): qty 50

## 2020-05-02 MED ORDER — PIPERACILLIN-TAZOBACTAM 3.375 G IVPB
3.3750 g | Freq: Three times a day (TID) | INTRAVENOUS | Status: DC
  Administered 2020-05-02 – 2020-05-03 (×2): 3.375 g via INTRAVENOUS
  Filled 2020-05-02 (×2): qty 50

## 2020-05-02 MED ORDER — SULFAMETHOXAZOLE-TRIMETHOPRIM 400-80 MG PO TABS: 2.0000 | ORAL_TABLET | Freq: Two times a day (BID) | ORAL | Status: DC

## 2020-05-02 MED ORDER — SULFAMETHOXAZOLE-TRIMETHOPRIM 800-160 MG PO TABS
1.0000 | ORAL_TABLET | Freq: Two times a day (BID) | ORAL | Status: DC
  Administered 2020-05-02: 1 via ORAL
  Filled 2020-05-02: qty 1

## 2020-05-02 MED ORDER — VANCOMYCIN HCL 1500 MG/300ML IV SOLN
1500.0000 mg | Freq: Two times a day (BID) | INTRAVENOUS | Status: DC
  Administered 2020-05-02 – 2020-05-03 (×3): 1500 mg via INTRAVENOUS
  Filled 2020-05-02 (×3): qty 300

## 2020-05-02 NOTE — TOC Progression Note (Addendum)
Transition of Care [facility]) - Progression Note    Patient Details  Name: Matthew Gibson MRN: [id number] Date of Birth: Apr 11, 1976  [facility]) CM/SW Contact  Geni Bers, RN Phone Number: 05/02/2020, 3:32 PM  Clinical Narrative:    Spoke with pt concerning PCP follow up. Pt states that he went to [facility] at [facility]. Offered to make an hospital follow up appointment for pt. Pt states that he will make FU appointment himself. Pt asked about rooming a room. Instruct pt to look up rooms for rent. Pt is active with [facility]    Expected Discharge Plan: Home/Self Care Barriers to Discharge: No Barriers Identified  Expected Discharge Plan and Services Expected Discharge Plan: Home/Self Care       Living arrangements for the past 2 months: Group Home([facility])                                       Social Determinants of Health (SDOH) Interventions    Readmission Risk Interventions No flowsheet data found.

## 2020-05-02 NOTE — Progress Notes (Signed)
PROGRESS NOTE    Matthew Gibson  YBO:[id number] DOB: Mar 27, 1976 DOA: 04/30/2020 PCP: Scot Jun, FNP    Brief Narrative:  Patient admitted to the hospital with a working diagnosis of right upper extremityand left wristcellulitis.  44 year old male with history of IV drug abuse. Reports draining pus from a right forearm skin lesion. Apparently he was using a syringe to draw fluid and drain his wound. 48 hours prior to hospitalization his wound got worse with increased pain, edema, and erythema. On his initial physical examination he was febrile 102.9, tachycardic 130s beats per minute, respiratory rate 20, blood pressure 165/79, oxygen saturation 97% on room air.His lungs were clear to auscultation bilaterally, heart S1-S2 present and rhythmic, soft abdomen with no lower extremity edema. Right distal forearm had a scabbed lesion, positive erythema, tender to palpation and decreased range of motion. Left distal forearm at the level of the wrist, positive edema, erythema, and tender to palpation. Sodium 129, potassium 4.1, chloride 95, bicarb 24, glucose 174, BUN 15, creatinine 0.91, white count 12.0, hemoglobin 12.9, hematocrit 38.8, platelets 300. SARS COVID-19 negative. CTupper extremities withsubcutaneous edema in the right forearm and distal left forearm, dorsum of the hand.No signs of abscess.   Assessment & Plan:   Principal Problem:   Cellulitis and abscess of upper extremity Active Problems:   Hyponatremia   IV drug abuse ([place])   1.  Bilateral asymmetric upper extremity cellulitis.  Patient was admitted to the medical he received IV antibiotic therapy with Vancomycin with good clinical response.  His white cell count has been stable between 12 and 13.  His blood cultures have been no growth.  Orthopedic surgery was consulted and recommended continue medical therapy no indication for surgery.  Patient will continue pain control with ibuprofen.    Due to  worsening rash and induration will add Zosyn and will continue with IV vancomycin.   2.  Hyponatremia.  Patient received normal saline intravenously with improvement of his electrolytes.  At the time of discharge sodium 136, potassium 4.0, chloride 104, bicarb 24, glucose 129, BUN 6 and creatinine 0.7.  3.  Anxiety/substance abuse, tobacco dependence.  No signs of acute withdrawal, he received as needed lorazepam for anxiety and a nicotine patch.  4.  Headache.  Successfully treated with ibuprofen.   Status is: Inpatient  Remains inpatient appropriate because:IV treatments appropriate due to intensity of illness or inability to take PO   Dispo: The patient is from: Home              Anticipated d/c is to: Home              Anticipated d/c date is: 1 day              Patient currently is not medically stable to d/c.    DVT prophylaxis: Enoxaparin   Code Status:   full  Family Communication:  No family at the bedside      Antimicrobials:   Vancomycin and Zosyn     Subjective: Patient continue to have pain and edema at his right upper extremity, worse erythema and induration. No nausea or vomiting,   Objective: Vitals:   05/01/20 1346 05/01/20 2121 05/02/20 0524 05/02/20 1335  BP: (!) 112/57 135/72 116/64 134/82  Pulse: 94 (!) 106 92 91  Resp: 17 18 18 18   Temp: 98.9 F (37.2 C) 97.9 F (36.6 C) 99.4 F (37.4 C) 97.6 F (36.4 C)  TempSrc: Oral  Oral Oral  SpO2:  97% 98% 97% 100%  Weight:      Height:       No intake or output data in the 24 hours ending 05/02/20 1600 Filed Weights   04/30/20 0228  Weight: 81.6 kg    Examination:   General: Not in pain or dyspnea, deconditioned  Neurology: Awake and alert, non focal  E ENT: no pallor, no icterus, oral mucosa moist Cardiovascular: No JVD. S1-S2 present, rhythmic, no gallops, rubs, or murmurs. No lower extremity edema. Pulmonary: positive  breath sounds bilaterally, adequate air movement, no wheezing, rhonchi  or rales. Gastrointestinal. Abdomen with, no organomegaly, non tender, no rebound or guarding Skin. Right forearm with edema and erythema, tender to palpation and positive induration.  Musculoskeletal: no joint deformities     Data Reviewed: I have personally reviewed following labs and imaging studies  CBC: Recent Labs  Lab 04/30/20 0213 05/02/20 0418  WBC 12.0* 13.0*  NEUTROABS 9.9* 9.9*  HGB 12.9* 11.8*  HCT 38.8* 36.1*  MCV 89.8 89.4  PLT 300 284   Basic Metabolic Panel: Recent Labs  Lab 04/30/20 0213 04/30/20 1516 05/02/20 0418  NA 129* 133* 136  K 4.1 4.2 4.0  CL 95* 99 104  CO2 24 26 24   GLUCOSE 174* 135* 129*  BUN 15 12 16   CREATININE 0.91 1.01 0.78  CALCIUM 9.6 8.4* 7.8*   GFR: Estimated Creatinine Clearance: 137.3 mL/min (by C-G formula based on SCr of 0.78 mg/dL). Liver Function Tests: Recent Labs  Lab 04/30/20 0213  AST 36  ALT 46*  ALKPHOS 90  BILITOT 0.4  PROT 6.9  ALBUMIN 3.8   No results for input(s): LIPASE, AMYLASE in the last 168 hours. No results for input(s): AMMONIA in the last 168 hours. Coagulation Profile: Recent Labs  Lab 04/30/20 0213  INR 1.1   Cardiac Enzymes: No results for input(s): CKTOTAL, CKMB, CKMBINDEX, TROPONINI in the last 168 hours. BNP (last 3 results) No results for input(s): PROBNP in the last 8760 hours. HbA1C: No results for input(s): HGBA1C in the last 72 hours. CBG: No results for input(s): GLUCAP in the last 168 hours. Lipid Profile: No results for input(s): CHOL, HDL, LDLCALC, TRIG, CHOLHDL, LDLDIRECT in the last 72 hours. Thyroid Function Tests: No results for input(s): TSH, T4TOTAL, FREET4, T3FREE, THYROIDAB in the last 72 hours. Anemia Panel: No results for input(s): VITAMINB12, FOLATE, FERRITIN, TIBC, IRON, RETICCTPCT in the last 72 hours.    Radiology Studies: I have reviewed all of the imaging during this hospital visit personally     Scheduled Meds: . nicotine  21 mg Transdermal  Daily  . pantoprazole  40 mg Oral Daily   Continuous Infusions: . piperacillin-tazobactam (ZOSYN)  IV    . vancomycin 1,500 mg (05/02/20 1226)     LOS: 2 days        Matthew Gibson 05/02/20, MD

## 2020-05-03 LAB — CREATININE, SERUM
Creatinine, Ser: 0.97 mg/dL (ref 0.61–1.24)
GFR calc Af Amer: >60 mL/min (ref >60)
GFR calc non Af Amer: >60 mL/min (ref >60)

## 2020-05-03 MED ORDER — ACETAMINOPHEN 325 MG PO TABS: 650.0000 mg | ORAL_TABLET | Freq: Four times a day (QID) | ORAL | 0 refills | Status: AC | PRN

## 2020-05-03 MED ORDER — SULFAMETHOXAZOLE-TRIMETHOPRIM 800-160 MG PO TABS: 1.0000 | ORAL_TABLET | Freq: Two times a day (BID) | ORAL | Status: DC

## 2020-05-03 MED ORDER — IBUPROFEN 400 MG PO TABS: 400.0000 mg | ORAL_TABLET | Freq: Four times a day (QID) | ORAL | 0 refills | Status: AC | PRN

## 2020-05-03 MED ORDER — SULFAMETHOXAZOLE-TRIMETHOPRIM 800-160 MG PO TABS: 1.0000 | ORAL_TABLET | Freq: Two times a day (BID) | ORAL | 0 refills | Status: AC

## 2020-05-03 NOTE — Plan of Care (Signed)
  Problem: Clinical Measurements: Goal: Ability to avoid or minimize complications of infection will improve Outcome: Progressing   Problem: Skin Integrity: Goal: Skin integrity will improve Outcome: Progressing   Problem: Education: Goal: Knowledge of General Education information will improve Description: Including pain rating scale, medication(s)/side effects and non-pharmacologic comfort measures Outcome: Progressing   

## 2020-05-03 NOTE — Discharge Summary (Signed)
Physician Discharge Summary  Matthew Gibson:[id number] DOB: [birth year] DOA: 04/30/2020  PCP: Bing Neighbors, FNP  Admit date: 04/30/2020 Discharge date: 05/03/2020  Admitted From: Home  Disposition:  Home   Recommendations for Outpatient Follow-up and new medication changes:  1. Follow up with Joaquin Courts FNP in 7 days.  2. Continue antibiotic therapy with Bactrim 1 tab bid for 7 days. 3. Pain control with acetaminophen and ibuprofen.   Home Health: no   Equipment/Devices: no    Discharge Condition: stable  CODE STATUS: full  Diet recommendation: regular.   Brief/Interim Summary: Patient admitted to the hospital with a working diagnosis of right upper extremityand left wristcellulitis.  44 year old male with history of IV drug abuse. Reports draining pus from a right forearm skin lesion. Apparently he was using a syringe to draw fluid and drain his wound. 48 hours prior to hospitalization his woundgot worse withincreased pain, edema, and erythema. On his initial physical examination he was febrile 102.9, tachycardic 130s beats per minute, respiratory rate 20, blood pressure 165/79, oxygen saturation 97% on room air.His lungswereclear to auscultation bilaterally, heart S1-S2 present and rhythmic, soft abdomen with no lower extremity edema. Right distal forearm had a scabbed lesion, positive erythema, tender to palpation and decreased range of motion. Left distal forearmat the level ofthe wrist, positiveedema, erythema, and tender to palpation. Left hand dorsum erythema.  Sodium 129, potassium 4.1, chloride 95, bicarb 24, glucose 174, BUN 15, creatinine 0.91, white count 12.0, hemoglobin 12.9, hematocrit 38.8, platelets 300. SARS COVID-19 negative. CTupper extremities withsubcutaneous edema in the right forearm and distal left forearm, dorsum of the hand.No signs of abscess.  1.Bilateral asymmetric upper extremity cellulitis.Patient was admitted to the  medical he received IV antibiotic therapy with Vancomycinand Zosyn. His white cell count has been stable between 12 and 13. His blood cultures have been no growth. His rash had significant improvement bilaterally.   Orthopedic surgery was consulted and recommended continue medical therapy no indication for surgery. Patient will continue pain control with ibuprofen and acetaminophen.   2.Hyponatremia.Patient received normal saline intravenously with improvementof his electrolytes. At the time of discharge sodium 136, potassium 4.0, chloride 104, bicarb 24, glucose 129, BUN 6 and creatinine 0.7.  3.Anxiety/substance abuse, tobacco dependence.No signs of acute withdrawal, hereceived as needed lorazepam for anxiety and a nicotine patch.  4.Headache. Successfully treated with ibuprofen.   Discharge Diagnoses:  Principal Problem:   Cellulitis and abscess of upper extremity Active Problems:   Hyponatremia   IV drug abuse [facility])    Discharge Instructions  Discharge Instructions    Diet - low sodium heart healthy   Complete by: As directed    Discharge instructions   Complete by: As directed    Please follow with primary care in 7 days.   Increase activity slowly   Complete by: As directed      Allergies as of 05/03/2020   No Known Allergies     Medication List    STOP taking these medications   cetirizine 10 MG tablet Commonly known as: ZYRTEC   fluticasone 50 MCG/ACT nasal spray Commonly known as: FLONASE   montelukast 10 MG tablet Commonly known as: SINGULAIR     TAKE these medications   acetaminophen 325 MG tablet Commonly known as: TYLENOL Take 2 tablets (650 mg total) by mouth every 6 (six) hours as needed for moderate pain.   ibuprofen 400 MG tablet Commonly known as: ADVIL Take 1 tablet (400 mg total) by mouth every 6 (  six) hours as needed (severe pain.).   sulfamethoxazole-trimethoprim 800-160 MG tablet Commonly known as: BACTRIM  DS Take 1 tablet by mouth every 12 (twelve) hours for 7 days.      Follow-up Information    Bing NeighborsHarris, Kimberly S, FNP Follow up in 1 week(s).   Specialty: Family Medicine Contact information: [address] [place] [id number] [phone]        [facility].   Specialty: Family Medicine Why: Please call for an appointment Contact information: [address], [address]  [place] [id number] [phone]         No Known Allergies  Consultations:     Procedures/Studies: DG Chest 2 View  Result Date: 04/30/2020 CLINICAL DATA:  Suspected sepsis, cough and fever, multiple abscess on the bilateral arms, self attempt at drainage EXAM: CHEST - 2 VIEW COMPARISON:  Radiograph 11/01/2012 FINDINGS: No consolidation, features of edema, pneumothorax, or effusion. Pulmonary vascularity is normally distributed. The cardiomediastinal contours are unremarkable. No acute osseous or soft tissue abnormality. IMPRESSION: No acute cardiopulmonary abnormality. Electronically Signed   By: Kreg ShropshirePrice  DeHay M.D.   On: 04/30/2020 02:37   CT Extrem Up Entire Arm L W/CM  Result Date: 04/30/2020 CLINICAL DATA:  Multiple bilateral forearm abscesses for several months. History intravenous drug abuse. EXAM: CT OF THE UPPER LEFT EXTREMITY WITH CONTRAST CT OF THE UPPER RIGHT EXTREMITY WITH CONTRAST TECHNIQUE: Multidetector CT imaging of the both upper extremities was performed according to the standard protocol following intravenous contrast administration. In order to image both upper extremities with contrast, the examination was performed with the patient supine and his arms elevated over his head. The images extend from the shoulders through the hands bilaterally. CONTRAST:  100mL OMNIPAQUE IOHEXOL 300 MG/ML  SOLN COMPARISON:  None. FINDINGS: Bones/Joint/Cartilage No significant osseous findings are seen in either upper extremity. The humerus, radius and ulna appear  normal bilaterally. There is no significant arthropathy or joint effusion. No evidence of bone destruction. Ligaments Suboptimally assessed by CT. Muscles and Tendons No focal muscular abnormality or abnormal enhancement identified in either upper extremity. No tendon disruption or tenosynovitis seen. Soft tissues On the right, there is subcutaneous edema within the dorsal and radial aspect of the mid to distal forearm. No focal fluid collection, foreign body or soft tissue emphysema seen within the right upper extremity. On the left, there is dorsal subcutaneous edema in the distal forearm and hand. There are small air bubbles in the dorsum of the hand. No focal fluid collection, foreign body or definite superficial thrombophlebitis identified. At the left elbow, there are 2 mildly prominent lymph nodes which are probable reactive epitrochlear nodes. IMPRESSION: 1. Subcutaneous edema in the right forearm and distal left forearm and dorsum of the hand, consistent with cellulitis. There are small air bubbles in the subcutaneous tissues of the dorsum of the left hand, which may relate to infection. No focal fluid collection, foreign body or definite superficial thrombophlebitis identified. 2. No evidence of osteomyelitis or septic joint. 3. Probable reactive epitrochlear lymph nodes at the left elbow. Electronically Signed   By: Carey BullocksWilliam  Veazey M.D.   On: 04/30/2020 11:53   CT Extrem Up Entire Arm R W/CM  Result Date: 04/30/2020 CLINICAL DATA:  Multiple bilateral forearm abscesses for several months. History intravenous drug abuse. EXAM: CT OF THE UPPER LEFT EXTREMITY WITH CONTRAST CT OF THE UPPER RIGHT EXTREMITY WITH CONTRAST TECHNIQUE: Multidetector CT imaging of the both upper extremities was performed according to  the standard protocol following intravenous contrast administration. In order to image both upper extremities with contrast, the examination was performed with the patient supine and his arms  elevated over his head. The images extend from the shoulders through the hands bilaterally. CONTRAST:  OMNIPAQUE IOHEXOL 300 MG/ML  SOLN COMPARISON:  None. FINDINGS: Bones/Joint/Cartilage No significant osseous findings are seen in either upper extremity. The humerus, radius and ulna appear normal bilaterally. There is no significant arthropathy or joint effusion. No evidence of bone destruction. Ligaments Suboptimally assessed by CT. Muscles and Tendons No focal muscular abnormality or abnormal enhancement identified in either upper extremity. No tendon disruption or tenosynovitis seen. Soft tissues On the right, there is subcutaneous edema within the dorsal and radial aspect of the mid to distal forearm. No focal fluid collection, foreign body or soft tissue emphysema seen within the right upper extremity. On the left, there is dorsal subcutaneous edema in the distal forearm and hand. There are small air bubbles in the dorsum of the hand. No focal fluid collection, foreign body or definite superficial thrombophlebitis identified. At the left elbow, there are 2 mildly prominent lymph nodes which are probable reactive epitrochlear nodes. IMPRESSION: 1. Subcutaneous edema in the right forearm and distal left forearm and dorsum of the hand, consistent with cellulitis. There are small air bubbles in the subcutaneous tissues of the dorsum of the left hand, which may relate to infection. No focal fluid collection, foreign body or definite superficial thrombophlebitis identified. 2. No evidence of osteomyelitis or septic joint. 3. Probable reactive epitrochlear lymph nodes at the left elbow. Electronically Signed   By: Carey Bullocks M.D.   On: 04/30/2020 11:53       Subjective: Patient is feeling better, rash has improved along with pain, no nausea or vomiting, no dyspnea or chest pain.   Discharge Exam: Vitals:   05/02/20 2204 05/03/20 0649  BP:  123/70  Pulse:  83  Resp:  20  Temp: 98.3 F (36.8  C) 97.8 F (36.6 C)  SpO2:  98%   Vitals:   05/02/20 1335 05/02/20 2202 05/02/20 2204 05/03/20 0649  BP: 134/82 115/60  123/70  Pulse: 91 (!) 102  83  Resp: 18 20  20   Temp: 97.6 F (36.4 C)  98.3 F (36.8 C) 97.8 F (36.6 C)  TempSrc: Oral  Oral Oral  SpO2: 100% 100%  98%  Weight:      Height:        General: Not in pain or dyspnea  Neurology: Awake and alert, non focal  E ENT: no pallor, no icterus, oral mucosa moist Cardiovascular: No JVD. S1-S2 present, rhythmic, no gallops, rubs, or murmurs. No lower extremity edema. Pulmonary: vesicular breath sounds bilaterally, adequate air movement, no wheezing, rhonchi or rales. Gastrointestinal. Abdomen soft with no organomegaly, non tender, no rebound or guarding Skin. Right forearm erythema, edema and induration has improved, left wrist edema and erythema continue to improve.  Musculoskeletal: no joint deformities   The results of significant diagnostics from this hospitalization (including imaging, microbiology, ancillary and laboratory) are listed below for reference.     Microbiology: Recent Results (from the past 240 hour(s))  Culture, blood (Routine x 2)     Status: None (Preliminary result)   Collection Time: 04/30/20  2:14 AM   Specimen: BLOOD  Result Value Ref Range Status   Specimen Description   Final    BLOOD Performed at [facility], [address] [place], [place]  Special Requests   Final    NONE Performed at [facility], [place] [address] [place], [place] [address]    Culture   Final    NO GROWTH 2 DAYS Performed at [place] [address], [place], [place] [address]    Report Status PENDING  Incomplete  Culture, blood (Routine x 2)     Status: None (Preliminary result)   Collection Time: 04/30/20  2:14 AM   Specimen: BLOOD  Result Value Ref Range Status   Specimen Description   Final    BLOOD Performed at [place] [address] [place], [place] [address]    Special Requests   Final    NONE Performed at [facility], [place] [address], [place], [place] [address]    Culture   Final    NO GROWTH 2 DAYS Performed at [place] [address] [place], [place] [address]    Report Status PENDING  Incomplete  SARS Coronavirus 2 by RT PCR (hospital order, performed in [facility] hospital lab) Nasopharyngeal Nasopharyngeal Swab     Status: None   Collection Time: 04/30/20  9:38 AM   Specimen: Nasopharyngeal Swab  Result Value Ref Range Status   SARS Coronavirus 2 NEGATIVE NEGATIVE Final    Comment: (NOTE) SARS-CoV-2 target nucleic acids are NOT DETECTED. The SARS-CoV-2 RNA is generally detectable in upper and lower respiratory specimens during the acute phase of infection. The lowest concentration of SARS-CoV-2 viral copies this assay can detect is 250 copies / mL. A negative result does not preclude SARS-CoV-2 infection and should not be used as the sole basis for treatment or other patient management decisions.  A negative result may occur with improper specimen collection / handling, submission of specimen other than nasopharyngeal swab, presence of viral mutation(s) within the areas targeted by this assay, and inadequate number of viral copies (<250 copies / mL). A negative result must be combined with clinical observations, patient history, and epidemiological information. Fact Sheet for Patients:   StrictlyIdeas.no Fact Sheet for Healthcare Providers: BankingDealers.co.za This test is not yet approved or cleared  by the [place] FDA and has been authorized for detection and/or diagnosis of SARS-CoV-2 by FDA under an Emergency Use Authorization (EUA).  This EUA will remain in effect (meaning this test can be used) for the duration of the COVID-19 declaration under Section 564(b)(1) of the Act, 21 U.S.C. section  360bbb-3(b)(1), unless the authorization is terminated or revoked sooner. Performed at [facility], [place] [address] [place], [place] [address]      Labs: BNP (last 3 results) No results for input(s): BNP in the last 8760 hours. Basic Metabolic Panel: Recent Labs  Lab 04/30/20 0213 04/30/20 1516 05/02/20 0418 05/03/20 0405  NA 129* 133* 136  --   K 4.1 4.2 4.0  --   CL 95* 99 104  --   CO2 24 26 24   --   GLUCOSE 174* 135* 129*  --   BUN 15 12 16   --   CREATININE 0.91 1.01 0.78 0.97  CALCIUM 9.6 8.4* 7.8*  --    Liver Function Tests: Recent Labs  Lab 04/30/20 0213  AST 36  ALT 46*  ALKPHOS 90  BILITOT 0.4  PROT 6.9  ALBUMIN 3.8   No results for input(s): LIPASE, AMYLASE in the last 168 hours. No results for input(s): AMMONIA in the last 168 hours. CBC: Recent Labs  Lab  04/30/20 0213 05/02/20 0418  WBC 12.0* 13.0*  NEUTROABS 9.9* 9.9*  HGB 12.9* 11.8*  HCT 38.8* 36.1*  MCV 89.8 89.4  PLT 300 284   Cardiac Enzymes: No results for input(s): CKTOTAL, CKMB, CKMBINDEX, TROPONINI in the last 168 hours. BNP: Invalid input(s): POCBNP CBG: No results for input(s): GLUCAP in the last 168 hours. D-Dimer No results for input(s): DDIMER in the last 72 hours. Hgb A1c No results for input(s): HGBA1C in the last 72 hours. Lipid Profile No results for input(s): CHOL, HDL, LDLCALC, TRIG, CHOLHDL, LDLDIRECT in the last 72 hours. Thyroid function studies No results for input(s): TSH, T4TOTAL, T3FREE, THYROIDAB in the last 72 hours.  Invalid input(s): FREET3 Anemia work up No results for input(s): VITAMINB12, FOLATE, FERRITIN, TIBC, IRON, RETICCTPCT in the last 72 hours. Urinalysis    Component Value Date/Time   COLORURINE YELLOW 04/30/2020 0214   APPEARANCEUR CLOUDY (A) 04/30/2020 0214   LABSPEC 1.012 04/30/2020 0214   PHURINE 8.0 04/30/2020 0214   GLUCOSEU NEGATIVE 04/30/2020 0214   HGBUR NEGATIVE 04/30/2020 0214   BILIRUBINUR NEGATIVE  04/30/2020 0214   KETONESUR NEGATIVE 04/30/2020 0214   PROTEINUR NEGATIVE 04/30/2020 0214   NITRITE NEGATIVE 04/30/2020 0214   LEUKOCYTESUR NEGATIVE 04/30/2020 0214   Sepsis Labs Invalid input(s): PROCALCITONIN,  WBC,  LACTICIDVEN Microbiology Recent Results (from the past 240 hour(s))  Culture, blood (Routine x 2)     Status: None (Preliminary result)   Collection Time: 04/30/20  2:14 AM   Specimen: BLOOD  Result Value Ref Range Status   Specimen Description   Final    BLOOD Performed at [facility], [address] [place], [place] [address]    Special Requests   Final    NONE Performed at [facility], [address], [place], [place] [address]    Culture   Final    NO GROWTH 2 DAYS Performed at [facility], [address] [place], [place] [address]    Report Status PENDING  Incomplete  Culture, blood (Routine x 2)     Status: None (Preliminary result)   Collection Time: 04/30/20  2:14 AM   Specimen: BLOOD  Result Value Ref Range Status   Specimen Description   Final    BLOOD Performed at [facility], [address] [place], [place] [address]    Special Requests   Final    NONE Performed at [facility], [address] [place], [place] [address]    Culture   Final    NO GROWTH 2 DAYS Performed at [facility], [address] [place], [place] [address]    Report Status PENDING  Incomplete  SARS Coronavirus 2 by RT PCR (hospital order, performed in [facility] hospital lab) Nasopharyngeal Nasopharyngeal Swab     Status: None   Collection Time: 04/30/20  9:38 AM   Specimen: Nasopharyngeal Swab  Result Value Ref Range Status   SARS Coronavirus 2 NEGATIVE NEGATIVE Final    Comment: (NOTE) SARS-CoV-2 target nucleic acids are NOT DETECTED. The SARS-CoV-2 RNA is generally detectable in upper and lower respiratory specimens during the acute phase of infection. The  lowest concentration of SARS-CoV-2 viral copies this assay can detect is 250 copies / mL. A negative result does not preclude SARS-CoV-2 infection and should not be used as the sole basis for treatment or other patient management decisions.  A negative result may occur with improper specimen collection / handling, submission of  specimen other than nasopharyngeal swab, presence of viral mutation(s) within the areas targeted by this assay, and inadequate number of viral copies (<250 copies / mL). A negative result must be combined with clinical observations, patient history, and epidemiological information. Fact Sheet for Patients:   BoilerBrush.com.cy Fact Sheet for Healthcare Providers: https://pope.com/ This test is not yet approved or cleared  by the [place] FDA and has been authorized for detection and/or diagnosis of SARS-CoV-2 by FDA under an Emergency Use Authorization (EUA).  This EUA will remain in effect (meaning this test can be used) for the duration of the COVID-19 declaration under Section 564(b)(1) of the Act, 21 U.S.C. section 360bbb-3(b)(1), unless the authorization is terminated or revoked sooner. Performed at [facility], [address] [place], [place] [address]      Time coordinating discharge: 45 minutes  SIGNED:   Coralie Keens, MD  [facility] 05/03/2020, 12:03 PM

## 2020-05-03 NOTE — Progress Notes (Signed)
Discharge medications and instructions reviewed with patient. Voiced no questions or concerns at this time. IV discontinued as ordered without complication. Vital signs stable. Patient ready for discharge.

## 2020-05-04 ENCOUNTER — Telehealth: Payer: Self-pay

## 2020-05-04 MED FILL — SULFAMETHOXAZOLE-TMP DS TAB: 800-160 | 7 days supply | Qty: 14 | Fill #0

## 2020-05-04 NOTE — Telephone Encounter (Signed)
Transition Care Management Follow-up Telephone Call  Date of discharge and from where: 05/03/2020, [facility]  How have you been since you were released from the hospital? He said he feels fine; just a little sore  Any questions or concerns?  he was concerned about the cost of the appt at [facility].   Explained to him that he does not have to pay up front for the office visit and he was pleased to hear that and was willing to schedule an appt.    He can apply for financial assistance after establishing care at the clinic.  The financial counselor can assist with [facility] Financial Assistance/Orange Card and Blue Card ( medication assistance)  Items Reviewed:  Did the pt receive and understand the discharge instructions provided?  yes  Medications obtained and verified? he does not have any medications. This CM stressed the importance of getting the antibiotic.  Provided him with the address for [facility].    Any new allergies since your discharge?  none reported   Do you have support at home?  has roommates  Other (ie: DME, Home Health, etc) no home health or  DME ordered   Functional Questionnaire: (I = Independent and D = Dependent) ADL's: independent   Follow up appointments reviewed:    PCP Hospital f/u appt confirmed?Dr Earlene Plater - 05/18/2020 @ 1050.   Specialist Hospital f/u appt confirmed? none scheduled at this time   Are transportation arrangements needed?  no  If their condition worsens, is the pt aware to call  their PCP or go to the ED?  yes  Was the patient provided with contact information for the PCP's office or ED?  he requested that the clinic addresses and phone numbers, along with appt date/time be text to him at # [phone] and the information was text as requested  Was the pt encouraged to call back with questions or concerns?yes

## 2020-05-05 LAB — CULTURE, BLOOD (ROUTINE X 2)
Culture: NO GROWTH
Culture: NO GROWTH

## 2020-05-17 ENCOUNTER — Telehealth: Payer: Self-pay

## 2020-05-17 NOTE — Telephone Encounter (Signed)
Called patient to do their pre-visit COVID screening.  Call went to voicemail. Unable to do prescreening.  

## 2020-05-18 ENCOUNTER — Ambulatory Visit: Payer: Self-pay | Admitting: Internal Medicine

## 2020-05-18 DIAGNOSIS — L03113 Cellulitis of right upper limb: Secondary | ICD-10-CM | POA: Insufficient documentation

## 2020-08-13 IMAGING — CR DG CHEST 2V
2 series · 2 of 2 positions shown · non-contrast
Comparison: Radiograph 11/01/2012

CLINICAL DATA: Suspected sepsis, cough and fever, multiple abscess
on the bilateral arms, self attempt at drainage

EXAM:
CHEST - 2 VIEW

[w chest pa]
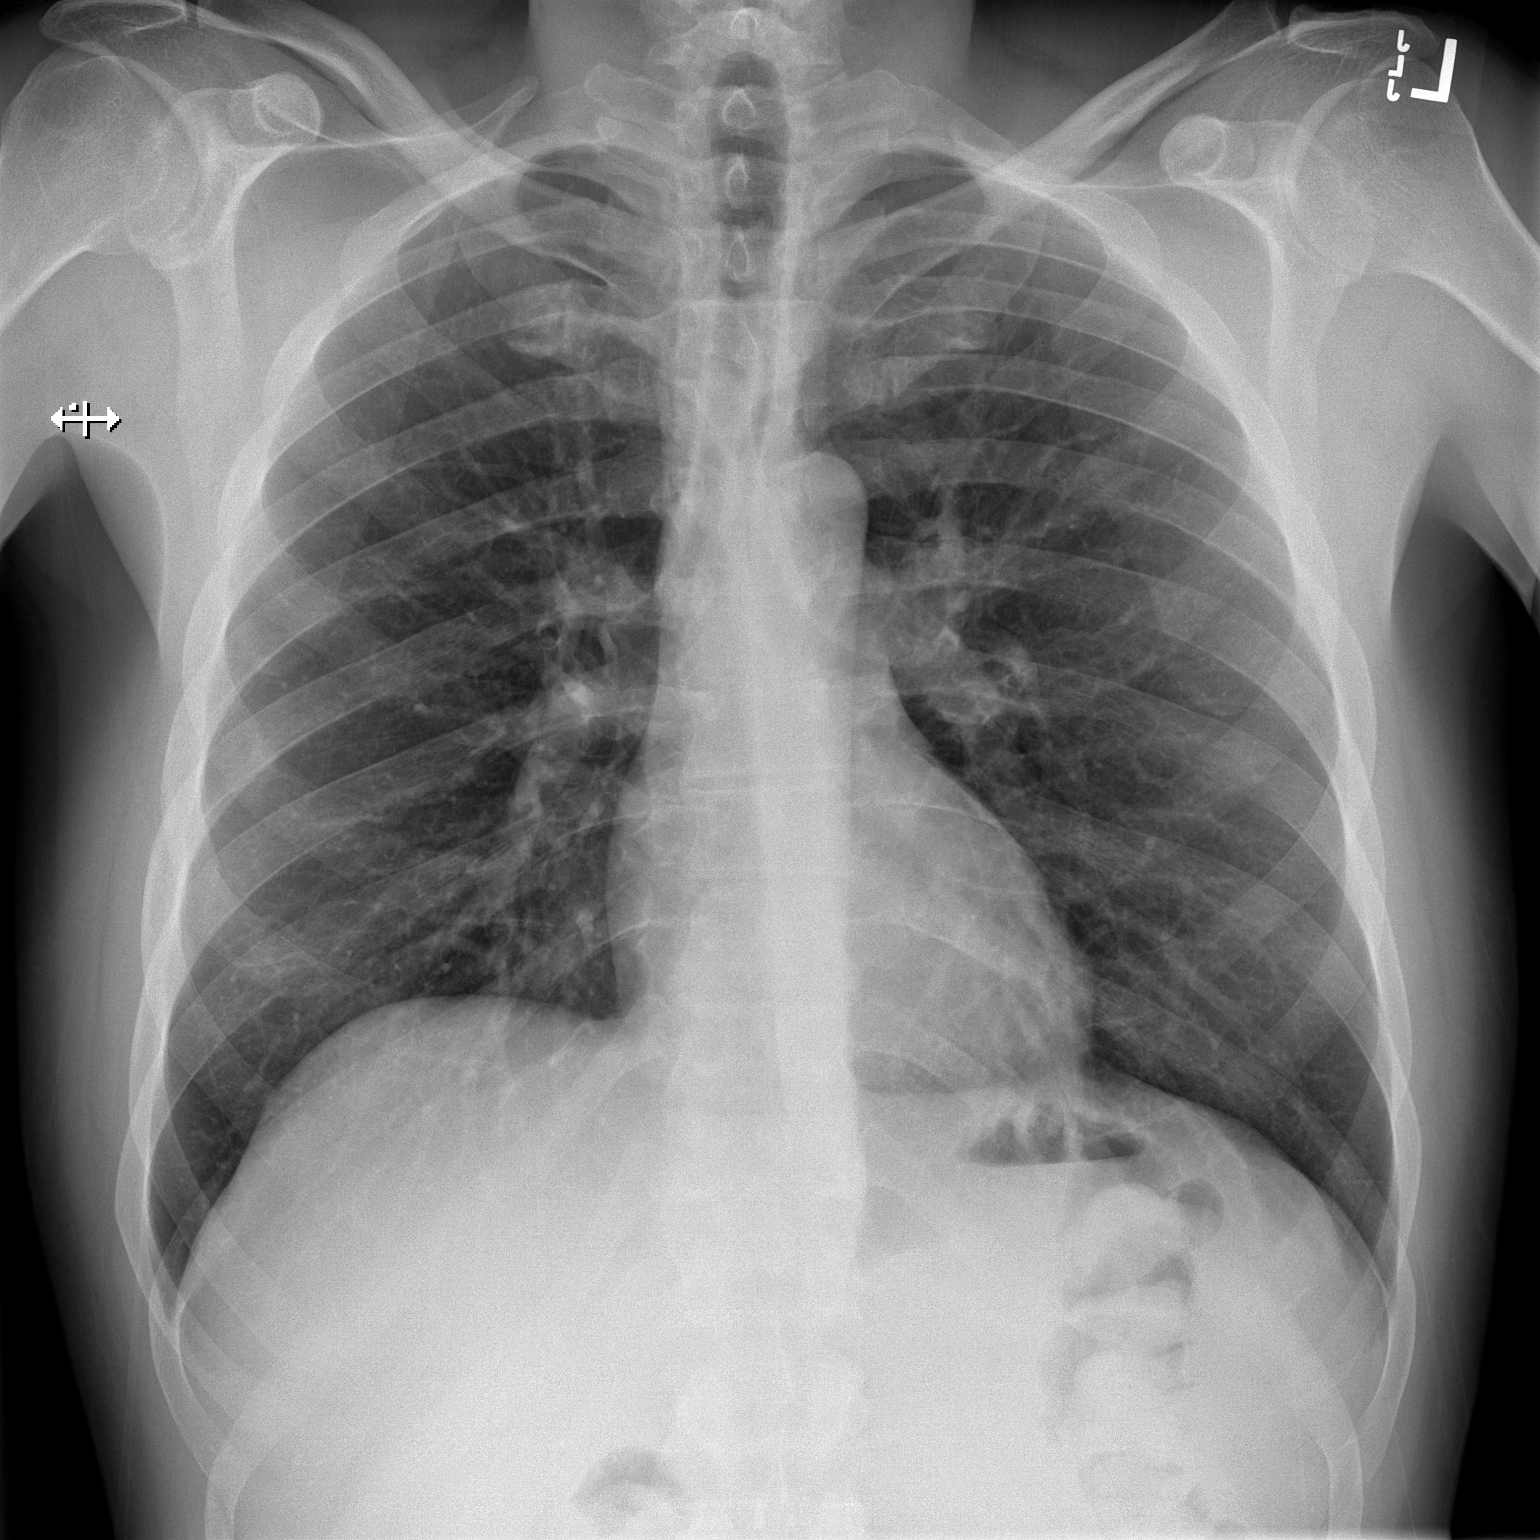

[w chest lat]
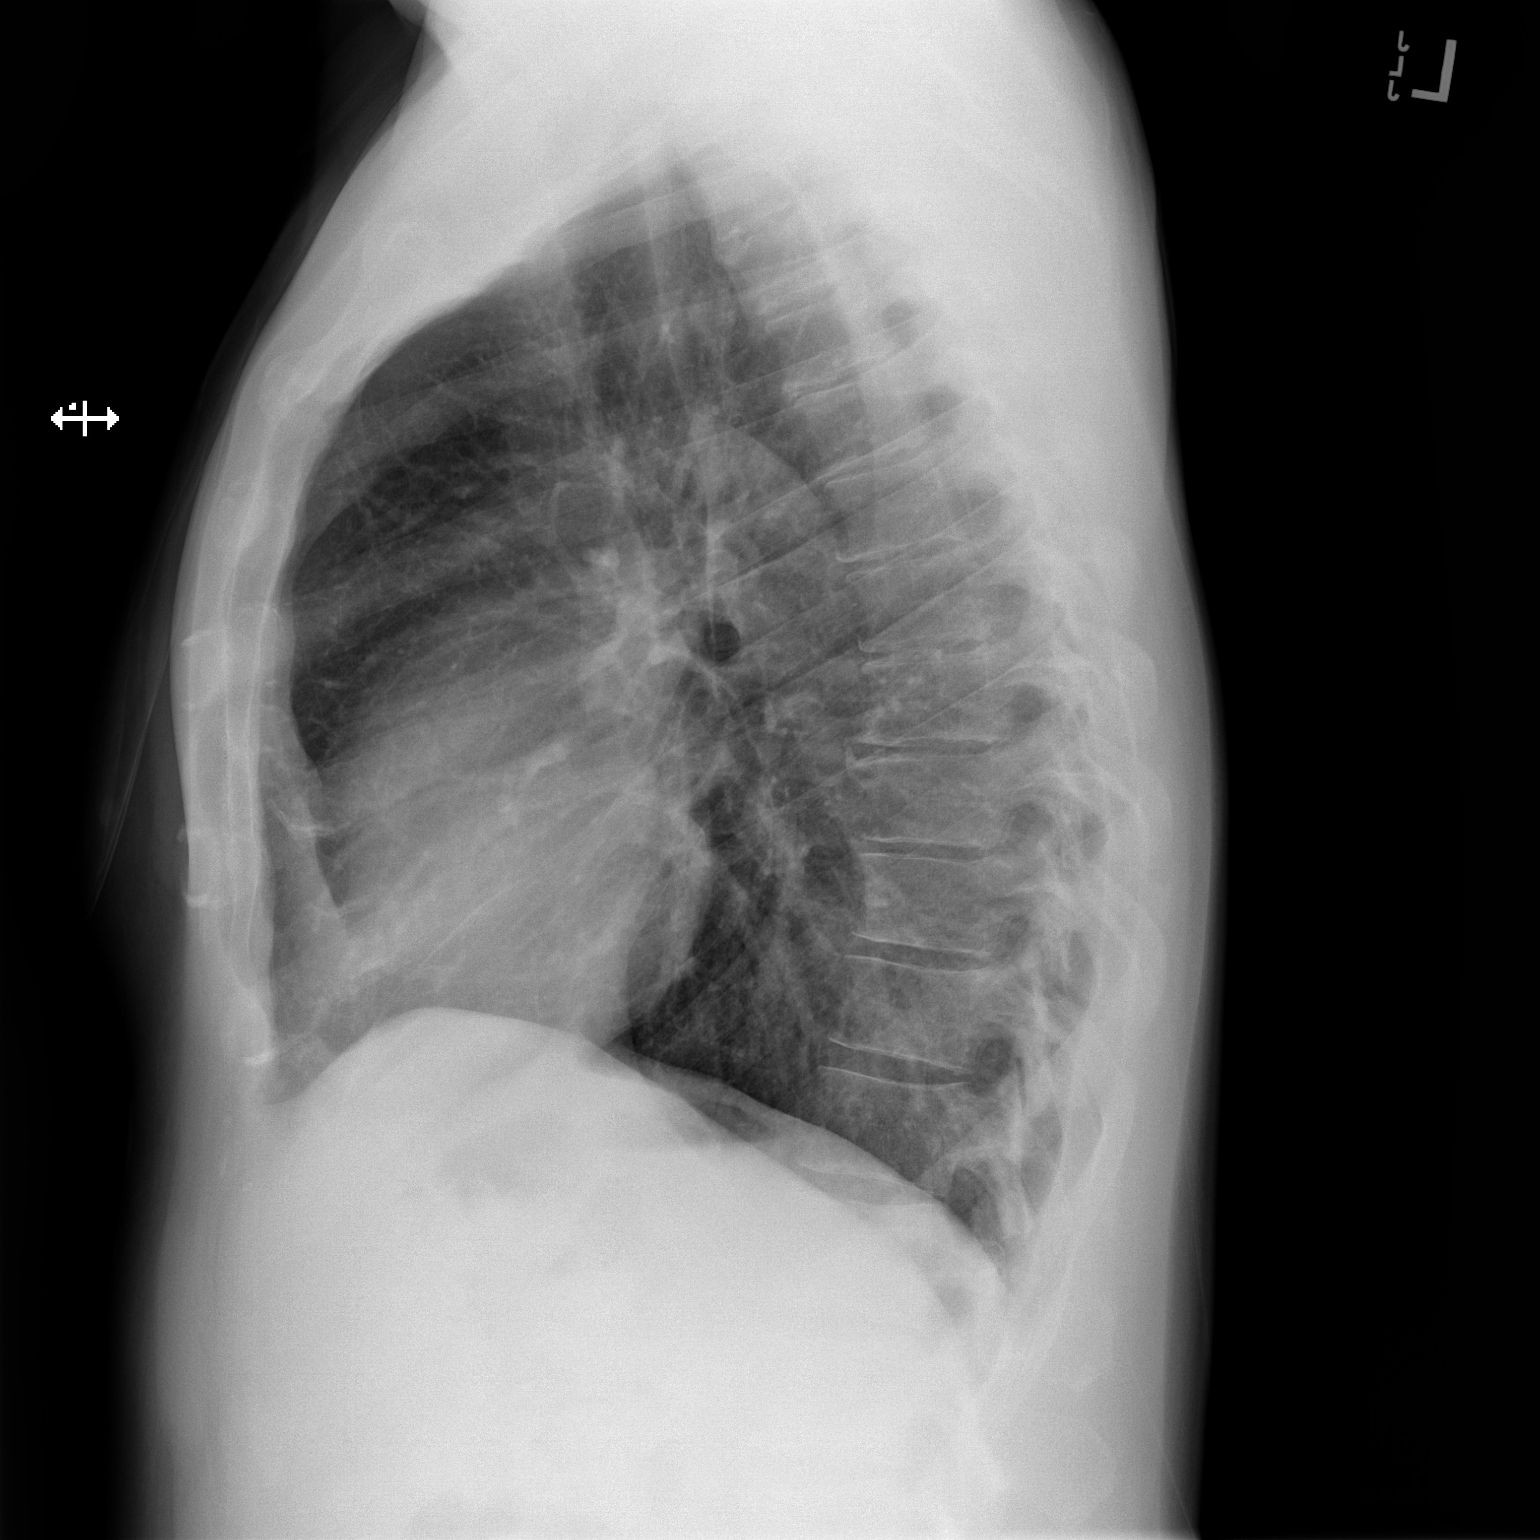

[2 of 2 positions shown; findings below may reference images not displayed]

FINDINGS: No consolidation, features of edema, pneumothorax, or effusion.
Pulmonary vascularity is normally distributed. The cardiomediastinal
contours are unremarkable. No acute osseous or soft tissue
abnormality.
IMPRESSION: No acute cardiopulmonary abnormality.

## 2020-08-13 IMAGING — CT CT EXTREM UP ENTIRE ARM*R* W/ CM
3 of 5 series · 15 of 33 positions shown, 18 images · IV contrast (omnipaque)
Comparison: None.

CLINICAL DATA: Multiple bilateral forearm abscesses for several
months. History intravenous drug abuse.

EXAM:
CT OF THE UPPER LEFT EXTREMITY WITH CONTRAST
CT OF THE UPPER RIGHT EXTREMITY WITH CONTRAST
TECHNIQUE: Multidetector CT imaging of the both upper extremities was performed
according to the standard protocol following intravenous contrast
administration. In order to image both upper extremities with
contrast, the examination was performed with the patient supine and
his arms elevated over his head. The images extend from the
shoulders through the hands bilaterally.
CONTRAST:  100mL OMNIPAQUE IOHEXOL 300 MG/ML  SOLN

[Series 4: axial st rt arm · axial · 0.65mm/px · z∈[+66,+680]mm · 9 of 363 slices shown, 12 images]
[im 28/363  soft-tissue]
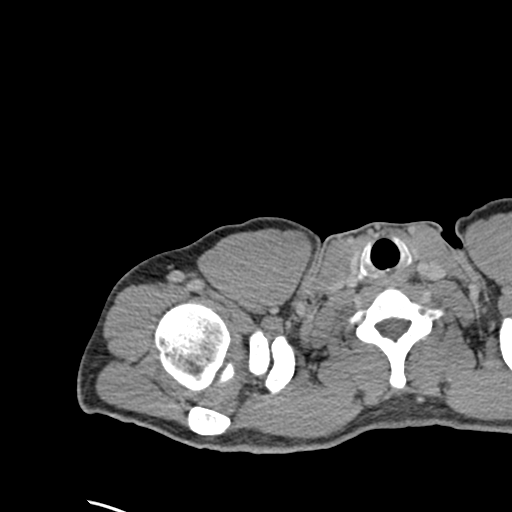
[im 28/363  bone]
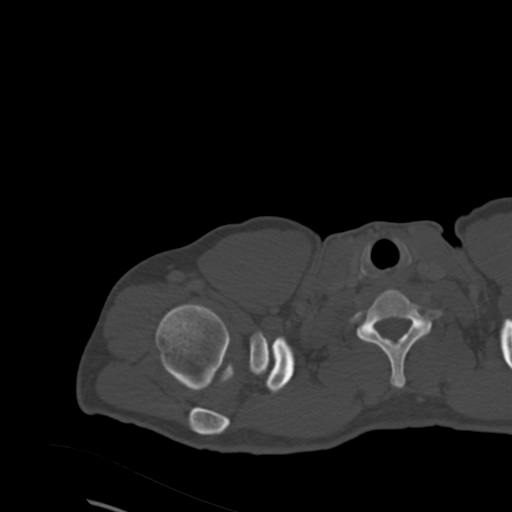
[im 84/363  bone]
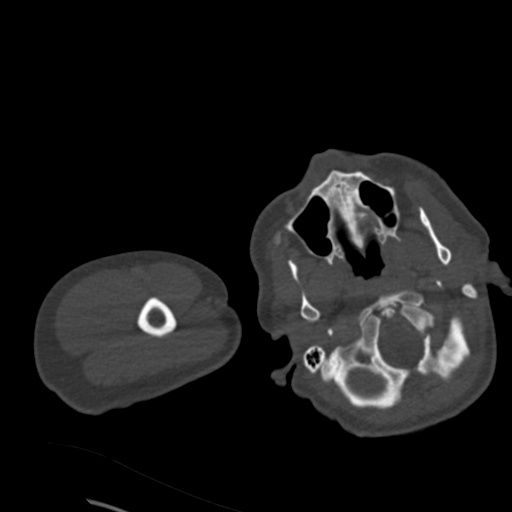
[im 112/363  bone]
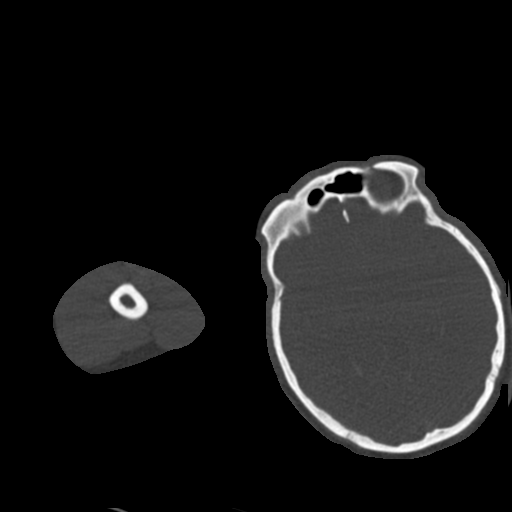
[im 140/363  bone]
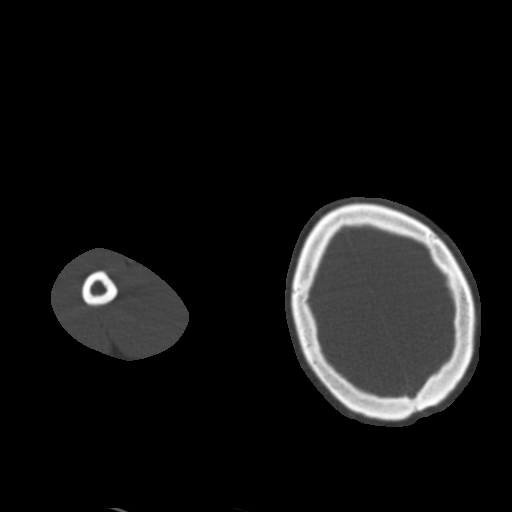
[im 195/363  soft-tissue]
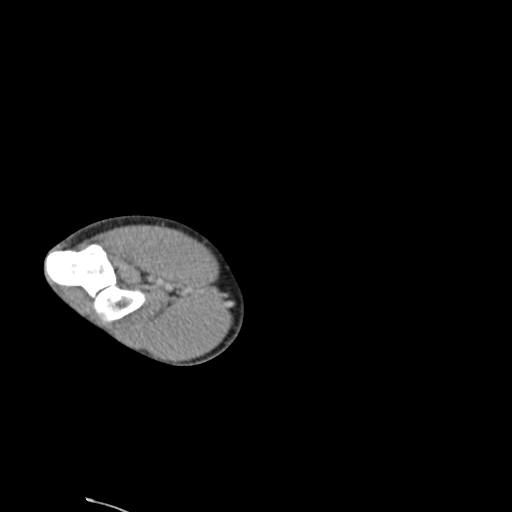
[im 195/363  bone]
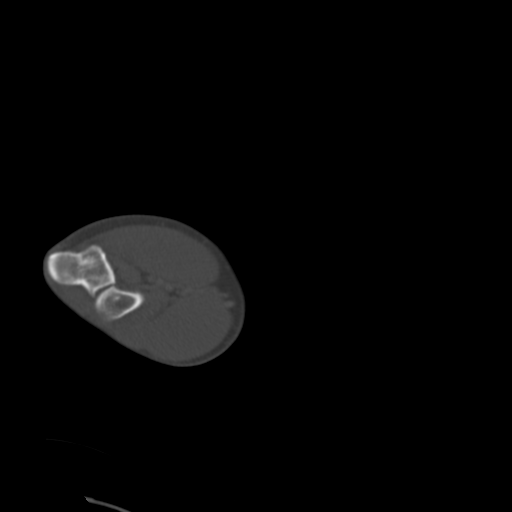
[im 223/363  bone]
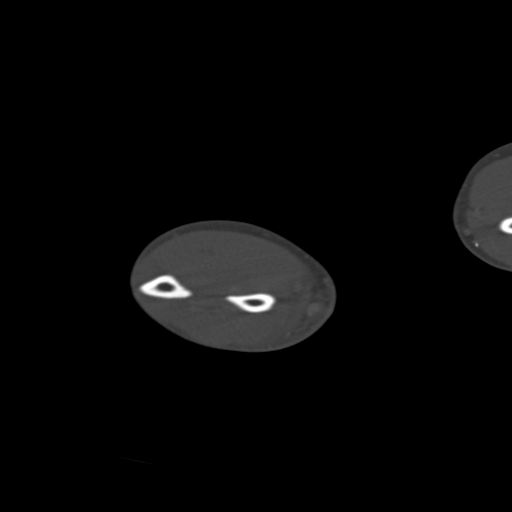
[im 251/363  bone]
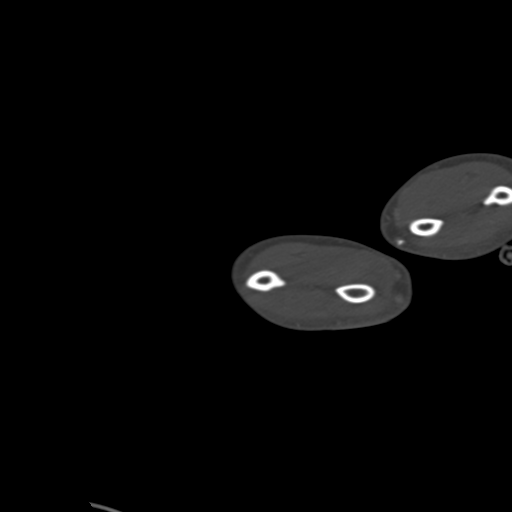
[im 307/363  bone]
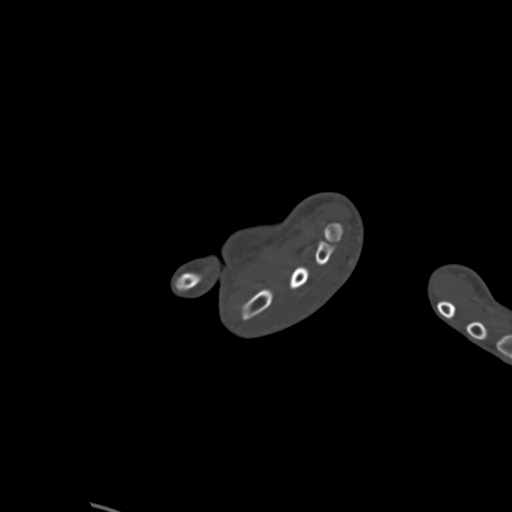
[im 335/363  soft-tissue]
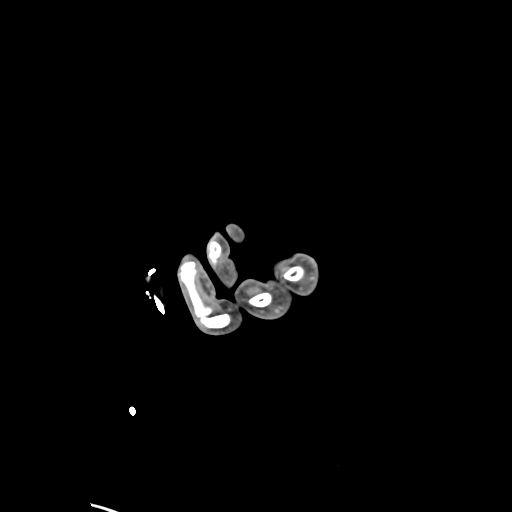
[im 335/363  bone]
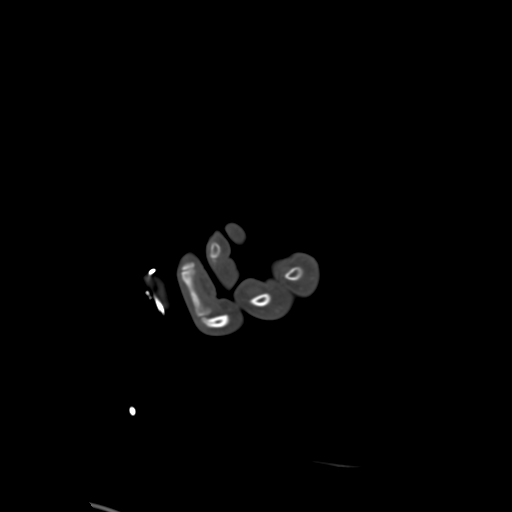

[Series 503: coronal rt arm bone · coronal · 1.42mm/px · 1 of 71 slices shown]
[im 36/71  bone]
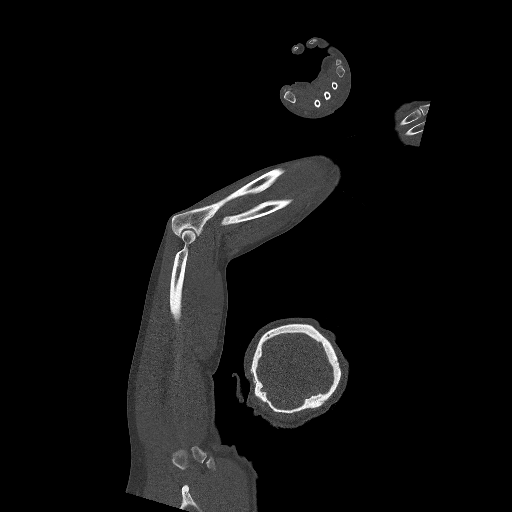

[Series 509: coronal right humerus · sagittal · 1.42mm/px · 5 of 76 slices shown]
[im 13/76  bone]
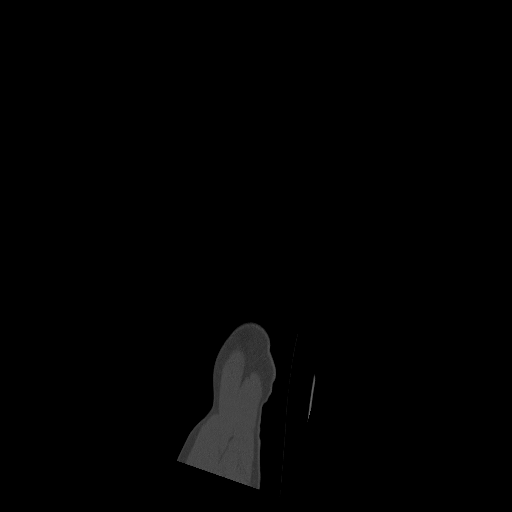
[im 26/76  bone]
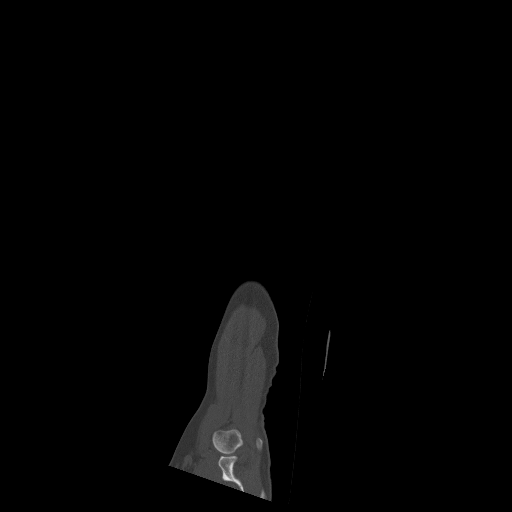
[im 38/76  bone]
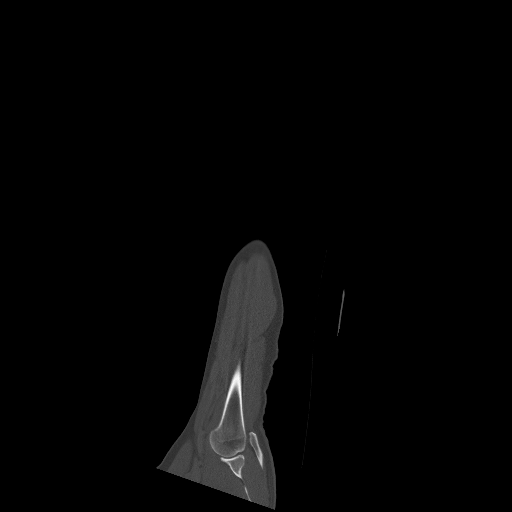
[im 51/76  bone]
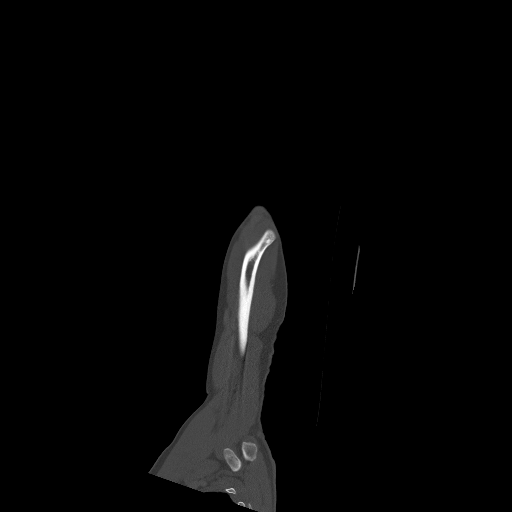
[im 63/76  bone]
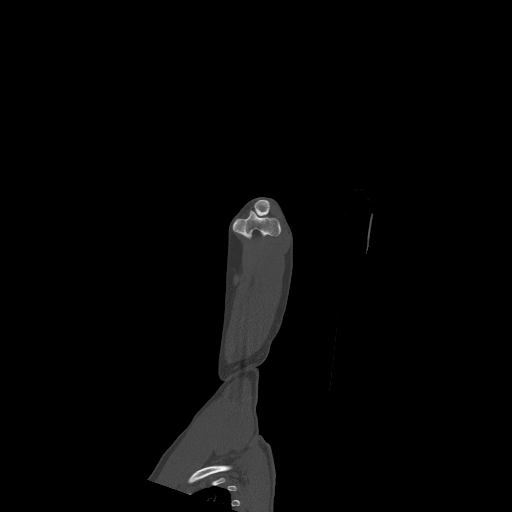

[15 of 33 positions shown; findings below may reference images not displayed]

FINDINGS: Bones/Joint/Cartilage

No significant osseous findings are seen in either upper extremity.
The humerus, radius and ulna appear normal bilaterally. There is no
significant arthropathy or joint effusion. No evidence of bone
destruction.

Ligaments

Suboptimally assessed by CT.

Muscles and Tendons

No focal muscular abnormality or abnormal enhancement identified in
either upper extremity. No tendon disruption or tenosynovitis seen.

Soft tissues

On the right, there is subcutaneous edema within the dorsal and
radial aspect of the mid to distal forearm. No focal fluid
collection, foreign body or soft tissue emphysema seen within the
right upper extremity.

On the left, there is dorsal subcutaneous edema in the distal
forearm and hand. There are small air bubbles in the dorsum of the
hand. No focal fluid collection, foreign body or definite
superficial thrombophlebitis identified. At the left elbow, there
are 2 mildly prominent lymph nodes which are probable reactive
epitrochlear nodes.
IMPRESSION: 1. Subcutaneous edema in the right forearm and distal left forearm
and dorsum of the hand, consistent with cellulitis. There are small
air bubbles in the subcutaneous tissues of the dorsum of the left
hand, which may relate to infection. No focal fluid collection,
foreign body or definite superficial thrombophlebitis identified.
2. No evidence of osteomyelitis or septic joint.
3. Probable reactive epitrochlear lymph nodes at the left elbow.

## 2020-11-09 ENCOUNTER — Emergency Department (HOSPITAL_COMMUNITY)
Admission: EM | Admit: 2020-11-09 | Discharge: 2020-11-09 | Disposition: A | Discharge status: Home / Self Care | Payer: Self-pay | Attending: Emergency Medicine | Admitting: Emergency Medicine

## 2020-11-09 ENCOUNTER — Encounter (HOSPITAL_COMMUNITY): Payer: Self-pay | Admitting: Emergency Medicine

## 2020-11-09 ENCOUNTER — Other Ambulatory Visit: Payer: Self-pay

## 2020-11-09 DIAGNOSIS — F1910 Other psychoactive substance abuse, uncomplicated: Secondary | ICD-10-CM | POA: Insufficient documentation

## 2020-11-09 DIAGNOSIS — F17210 Nicotine dependence, cigarettes, uncomplicated: Secondary | ICD-10-CM | POA: Insufficient documentation

## 2020-11-09 DIAGNOSIS — T50901A Poisoning by unspecified drugs, medicaments and biological substances, accidental (unintentional), initial encounter: Secondary | ICD-10-CM | POA: Insufficient documentation

## 2020-11-09 LAB — CBC WITH DIFFERENTIAL/PLATELET
Abs Immature Granulocytes: 0.12 10*3/uL — ABNORMAL HIGH (ref 0.00–0.07)
Basophils Absolute: 0 10*3/uL (ref 0.0–0.1)
Basophils Relative: 0 %
Eosinophils Absolute: 0 10*3/uL (ref 0.0–0.5)
Eosinophils Relative: 0 %
HCT: 41.5 % (ref 39.0–52.0)
Hemoglobin: 13.1 g/dL (ref 13.0–17.0)
Immature Granulocytes: 1 %
Lymphocytes Relative: 3 %
Lymphs Abs: 0.7 10*3/uL (ref 0.7–4.0)
MCH: 29 pg (ref 26.0–34.0)
MCHC: 31.6 g/dL (ref 30.0–36.0)
MCV: 92 fL (ref 80.0–100.0)
Monocytes Absolute: 0.9 10*3/uL (ref 0.1–1.0)
Monocytes Relative: 4 %
Neutro Abs: 19.1 10*3/uL — ABNORMAL HIGH (ref 1.7–7.7)
Neutrophils Relative %: 92 %
Platelets: 348 10*3/uL (ref 150–400)
RBC: 4.51 MIL/uL (ref 4.22–5.81)
RDW: 13.3 % (ref 11.5–15.5)
WBC: 20.9 10*3/uL — ABNORMAL HIGH (ref 4.0–10.5)
nRBC: 0 % (ref 0.0–0.2)

## 2020-11-09 LAB — COMPREHENSIVE METABOLIC PANEL
ALT: 47 U/L — ABNORMAL HIGH (ref 0–44)
AST: 41 U/L (ref 15–41)
Albumin: 3.5 g/dL (ref 3.5–5.0)
Alkaline Phosphatase: 78 U/L (ref 38–126)
Anion gap: 9 (ref 5–15)
BUN: 16 mg/dL (ref 6–20)
CO2: 26 mmol/L (ref 22–32)
Calcium: 8.8 mg/dL — ABNORMAL LOW (ref 8.9–10.3)
Chloride: 104 mmol/L (ref 98–111)
Creatinine, Ser: 1.09 mg/dL (ref 0.61–1.24)
GFR, Estimated: >60 mL/min (ref >60)
Glucose, Bld: 96 mg/dL (ref 70–99)
Potassium: 4.4 mmol/L (ref 3.5–5.1)
Sodium: 139 mmol/L (ref 135–145)
Total Bilirubin: 0.3 mg/dL (ref 0.3–1.2)
Total Protein: 6.1 g/dL — ABNORMAL LOW (ref 6.5–8.1)

## 2020-11-09 MED ORDER — NALOXONE HCL 4 MG/0.1ML NA LIQD
1.0000 | Freq: Once | NASAL | Status: AC
  Administered 2020-11-09: 1 via NASAL
  Filled 2020-11-09: qty 4

## 2020-11-09 MED ORDER — SODIUM CHLORIDE 0.9 % IV BOLUS
1000.0000 mL | Freq: Once | INTRAVENOUS | Status: AC
  Administered 2020-11-09: 1000 mL via INTRAVENOUS

## 2020-11-09 NOTE — ED Triage Notes (Addendum)
Report received from [facility]  Pt found behind a dumpster at an apartment complex.  Unresponsive and resp arrested.  Fire administered Narcan 2mg  intranasal and was bagging pt on EMS arrival.  EMS gave additional 1mg  Narcan intranasal and pt responding.  Pt currently lethargic.

## 2020-11-09 NOTE — Discharge Instructions (Signed)
Return the emergency department for any difficulty breathing, chest pain, vomiting or any other worsening concerning symptoms.

## 2020-11-09 NOTE — ED Notes (Signed)
PA at bedside.

## 2020-11-09 NOTE — ED Provider Notes (Signed)
[facility] EMERGENCY DEPARTMENT Provider Note   CSN: [id number] Arrival date & time: 11/09/20  1204     History Chief Complaint  Patient presents with  . Drug Overdose    Matthew Gibson is a 44 y.o. male has been x-ray of IV drug use brought by EMS for evaluation of unresponsiveness.  Per police, patient was found by dumpster in an apartment complex.  He was on responsive at the time.  Fire was first on scene and administered Narcan.  They were bagging patient when EMS got there.  EMS gave an additional 1 mg of Narcan and patient started responding.  On initial arrival, he is lethargic but is able to answer to verbal stimuli and answer questions.   EM LEVEL 5 CAVEAT DUE TO AMS  The history is provided by the patient.       Past Medical History:  Diagnosis Date  . ADHD   . Back pain     Patient Active Problem List   Diagnosis Date Noted  . Cellulitis of right upper extremity 05/18/2020  . Cellulitis and abscess of upper extremity 04/30/2020  . Hyponatremia 04/30/2020  . IV drug abuse (HCC) 04/30/2020    Past Surgical History:  Procedure Laterality Date  . NO PAST SURGERIES         Family History  Problem Relation Age of Onset  . Healthy Mother   . Cancer Father   . Hypertension Father   . Stroke Neg Hx     Social History   Tobacco Use  . Smoking status: Current Every Day Smoker  . Smokeless tobacco: Never Used  Substance Use Topics  . Alcohol use: Yes  . Drug use: Yes    Home Medications Prior to Admission medications   Medication Sig Start Date End Date Taking? Authorizing Provider  acetaminophen (TYLENOL) 325 MG tablet Take 2 tablets (650 mg total) by mouth every 6 (six) hours as needed for moderate pain. 05/03/20   Arrien, York Ram, MD  ibuprofen (ADVIL) 400 MG tablet Take 1 tablet (400 mg total) by mouth every 6 (six) hours as needed (severe pain.). 05/03/20   Arrien, York Ram, MD    Allergies    Patient has no  known allergies.  Review of Systems   Review of Systems  Unable to perform ROS: Mental status change    Physical Exam Updated Vital Signs BP (!) 162/64   Pulse 96   Temp 98 F (36.7 C) (Oral)   Resp 12   Ht 6\' 1"  (1.854 m)   Wt 79.4 kg   SpO2 97%   BMI 23.09 kg/m   Physical Exam Vitals and nursing note reviewed.  Constitutional:      Appearance: Normal appearance. He is well-developed.     Comments: Lethargic but responsive to painful stimuli.  HENT:     Head: Normocephalic and atraumatic.  Eyes:     General: Lids are normal.     Conjunctiva/sclera: Conjunctivae normal.     Pupils: Pupils are equal, round, and reactive to light.     Comments: Pinpoint pupils bilaterally.  Cardiovascular:     Rate and Rhythm: Normal rate and regular rhythm.     Pulses: Normal pulses.     Heart sounds: Normal heart sounds. No murmur heard.  No friction rub. No gallop.   Pulmonary:     Effort: Pulmonary effort is normal.     Breath sounds: Normal breath sounds.     Comments: Protecting his  airway  Abdominal:     Palpations: Abdomen is soft. Abdomen is not rigid.     Tenderness: There is no abdominal tenderness. There is no guarding.     Comments: Abdomen is soft, non-distended, non-tender. No rigidity, No guarding. No peritoneal signs.  Musculoskeletal:        General: Normal range of motion.     Cervical back: Full passive range of motion without pain.  Skin:    General: Skin is warm and dry.     Capillary Refill: Capillary refill takes less than 2 seconds.  Neurological:     Mental Status: He is oriented to person, place, and time. He is lethargic.  Psychiatric:        Speech: Speech normal.     ED Results / Procedures / Treatments   Labs (all labs ordered are listed, but only abnormal results are displayed) Labs Reviewed  CBC WITH DIFFERENTIAL/PLATELET - Abnormal; Notable for the following components:      Result Value   WBC 20.9 (*)    Neutro Abs 19.1 (*)    Abs  Immature Granulocytes 0.12 (*)    All other components within normal limits  COMPREHENSIVE METABOLIC PANEL  URINALYSIS, ROUTINE W REFLEX MICROSCOPIC  RAPID URINE DRUG SCREEN, HOSP PERFORMED    EKG EKG Interpretation  Date/Time:  Wednesday November 09 2020 12:13:45 EST Ventricular Rate:  79 PR Interval:    QRS Duration: 100 QT Interval:  408 QTC Calculation: 468 R Axis:   72 Text Interpretation: Sinus rhythm When compared to prior, similar appearnce. NO STEMI Confirmed by Theda Belfast ([id number]) on 11/09/2020 2:47:02 PM   Radiology No results found.  Procedures Procedures (including critical care time)  Medications Ordered in ED Medications  naloxone (NARCAN) nasal spray 4 mg/0.1 mL (1 spray Nasal Provided for home use 11/09/20 1217)  sodium chloride 0.9 % bolus 1,000 mL (0 mLs Intravenous Stopped 11/09/20 1443)  naloxone (NARCAN) nasal spray 4 mg/0.1 mL (1 spray Nasal Provided for home use 11/09/20 1422)    ED Course  I have reviewed the triage vital signs and the nursing notes.  Pertinent labs & imaging results that were available during my care of the patient were reviewed by me and considered in my medical decision making (see chart for details).    MDM Rules/Calculators/A&P                          44 year old male brought in by EMS for evaluation of drug overdose.  He was found behind a dumpster and was unresponsive on fire arrival.  They started bagging him and gave him Narcan which made him more responsive.  On EMS arrival, he got additional Narcan with increased responsiveness.  On ED arrival, he is lethargic but responsive to painful stimuli.  Will give additional Narcan here in the ED.  Vital signs are stable.    Reevaluation after Narcan.  Patient more alert.  He is able to tell me his name, where he is and what year it is.  He states that he took heroin orally.  He states he did not do any IV drug use.  He states he has not any alcohol.  He denies any SI,  HI.  Patient with some slight soft blood pressures.  Will give bolus of fluid.  He is requesting something to eat.  Reevaluation.  Patient is sitting up, he is eating.  He states he wants to leave.  He is  alert and oriented x3 and able answer all my questions.  He states he does not have any SI, HI.  He states that this was accidental overdose.  Patient states he does not want to stay here any longer and that he is ready to go home.  At this time, patient exhibits full medical decision-making capacity and appears clinically sober.  Patient will be discharged.  Portions of this note were generated with Scientist, clinical (histocompatibility and immunogenetics). Dictation errors may occur despite best attempts at proofreading.   Final Clinical Impression(s) / ED Diagnoses Final diagnoses:  Accidental drug overdose, initial encounter  Polysubstance abuse [facility])    Rx / DC Orders ED Discharge Orders    None       Maxwell Caul, PA-C 11/09/20 1513    Tegeler, Canary Brim, MD 11/09/20 [phone]

## 2023-10-31 ENCOUNTER — Emergency Department (HOSPITAL_COMMUNITY)
Admission: EM | Admit: 2023-10-31 | Discharge: 2023-11-01 | Disposition: A | Discharge status: Home / Self Care | Payer: Self-pay | Attending: Emergency Medicine | Admitting: Emergency Medicine

## 2023-10-31 DIAGNOSIS — F22 Delusional disorders: Secondary | ICD-10-CM | POA: Insufficient documentation

## 2023-11-01 ENCOUNTER — Other Ambulatory Visit: Payer: Self-pay

## 2023-11-01 ENCOUNTER — Encounter (HOSPITAL_COMMUNITY): Payer: Self-pay

## 2023-11-01 MED ORDER — PERMETHRIN 5 % EX CREA: TOPICAL_CREAM | CUTANEOUS | 0 refills | Status: AC

## 2023-11-01 MED ORDER — PERMETHRIN 5 % EX CREA: TOPICAL_CREAM | CUTANEOUS | 0 refills | Status: DC

## 2023-11-01 NOTE — ED Notes (Signed)
Pt provided with AVS.  Education complete; all questions answered.  Pt provided with bandage supplies for his sores per request.  Pt leaving ED in stable condition at this time, ambulatory with all belongings.

## 2023-11-01 NOTE — ED Provider Notes (Signed)
  [facility]EMER[facility]Y DEPT [facility] Emergency Department Provider Note MRN:  [id number]  Arrival date & time: 11/01/23     Chief Complaint   Drug Problem   History of Present Illness   Matthew Gibson is a 47 y.o. year-old male presents to the ED with chief complaint of parasites in skin.  He states that he has sores that the parasites live in and then they jump from place to place.  Hx of meth use, but states he's not used recently.  Denies any other symptoms.  History provided by patient.   Review of Systems  Pertinent positive and negative review of systems noted in HPI.    Physical Exam   Vitals:   11/01/23 0010 11/01/23 0359  BP: (!) 178/96 (!) 141/82  Pulse: (!) 103 93  Resp: 20 17  Temp: 98.3 F (36.8 C) 98.6 F (37 C)  SpO2: 100% 95%    CONSTITUTIONAL:  non toxic-appearing, NAD NEURO:  Alert and oriented x 3, CN 3-12 grossly intact EYES:  eyes equal and reactive ENT/NECK:  Supple, no stridor  CARDIO:  normal rate, regular rhythm, appears well-perfused  PULM:  No respiratory distress, CTAB GI/GU:  non-distended,  MSK/SPINE:  No gross deformities, no edema, moves all extremities  SKIN:  no rash, atraumatic   *Additional and/or pertinent findings included in MDM below  Diagnostic and Interventional Summary    EKG Interpretation Date/Time:    Ventricular Rate:    PR Interval:    QRS Duration:    QT Interval:    QTC Calculation:   R Axis:      Text Interpretation:         Labs Reviewed - No data to display  No orders to display    Medications - No data to display   Procedures  /  Critical Care Procedures  ED Course and Medical Decision Making  I have reviewed the triage vital signs, the nursing notes, and pertinent available records from the EMR.  Social Determinants Affecting Complexity of Care: Patient has no clinically significant social determinants affecting this chief complaint..   ED Course:    Medical Decision Making    Patient here with complaints of parasites laying eggs under his skin and jumping into various wounds on the skin.  Hx of meth use, which could be contributing, but also concern for delusional parasitosis.  Will recommend behavioral health follow-up.     Consultants: No consultations were needed in caring for this patient.   Treatment and Plan: Emergency department workup does not suggest an emergent condition requiring admission or immediate intervention beyond  what has been performed at this time. The patient is safe for discharge and has  been instructed to return immediately for worsening symptoms, change in  symptoms or any other concerns    Final Clinical Impressions(s) / ED Diagnoses     ICD-10-CM   1. Delusions of parasitosis [facility])  F22       ED Discharge Orders          Ordered    permethrin (ELIMITE) 5 % cream        11/01/23 0422              Discharge Instructions Discussed with and Provided to Patient:   Discharge Instructions   None      Roxy Horseman, PA-C 11/01/23 Valetta Fuller, MD 11/01/23 [phone]

## 2023-11-01 NOTE — ED Triage Notes (Signed)
Picking spots and sores on face.   Admits to methamphetamine use prior to arrival.   Says there are parasites under skin laying eggs. Attempting to self remove with tweezers.

## 2024-02-15 ENCOUNTER — Emergency Department (HOSPITAL_COMMUNITY)
Admission: EM | Admit: 2024-02-15 | Discharge: 2024-02-15 | Discharge status: Against Medical Advice | Payer: Self-pay | Attending: Emergency Medicine | Admitting: Emergency Medicine

## 2024-02-15 DIAGNOSIS — Z5321 Procedure and treatment not carried out due to patient leaving prior to being seen by health care provider: Secondary | ICD-10-CM | POA: Insufficient documentation

## 2024-02-15 DIAGNOSIS — T402X1A Poisoning by other opioids, accidental (unintentional), initial encounter: Secondary | ICD-10-CM | POA: Insufficient documentation

## 2024-02-15 NOTE — ED Triage Notes (Signed)
 Pt comes via [facility] after a drug overdose in [place]. Pt given 1mg  narcan IN and 0.5 IV. Pt during triage refused to be seen and became violent, pt refused to let any staff remove his IV, pt took off running down hallway

## 2024-02-15 NOTE — ED Provider Notes (Signed)
 Patient had arrived to the emergency department after what sounds like an opioid overdose.  As he arrived to the room he then jumped out of the stretcher and ran off with IV in place.  I was unable to see or evaluate him.  Patient left without being seen  1. Patient left without being seen       Melene Plan, DO 02/15/24 2140
